# Patient Record
Sex: Female | Born: 1950 | Race: White | Hispanic: No | Smoking: Never smoker
Health system: Southern US, Community
[De-identification: ages and names within clinical notes are randomized; demographics above are authoritative.]

## PROBLEM LIST (undated history)

## (undated) DIAGNOSIS — E039 Hypothyroidism, unspecified: Secondary | ICD-10-CM

## (undated) DIAGNOSIS — R7303 Prediabetes: Secondary | ICD-10-CM

## (undated) DIAGNOSIS — E119 Type 2 diabetes mellitus without complications: Secondary | ICD-10-CM

## (undated) DIAGNOSIS — C9110 Chronic lymphocytic leukemia of B-cell type not having achieved remission: Secondary | ICD-10-CM

## (undated) DIAGNOSIS — M1712 Unilateral primary osteoarthritis, left knee: Secondary | ICD-10-CM

## (undated) DIAGNOSIS — Z87442 Personal history of urinary calculi: Secondary | ICD-10-CM

## (undated) DIAGNOSIS — I10 Essential (primary) hypertension: Secondary | ICD-10-CM

## (undated) DIAGNOSIS — K219 Gastro-esophageal reflux disease without esophagitis: Secondary | ICD-10-CM

## (undated) DIAGNOSIS — M1990 Unspecified osteoarthritis, unspecified site: Secondary | ICD-10-CM

## (undated) DIAGNOSIS — D649 Anemia, unspecified: Secondary | ICD-10-CM

## (undated) DIAGNOSIS — M109 Gout, unspecified: Secondary | ICD-10-CM

## (undated) HISTORY — PX: ABDOMINAL HYSTERECTOMY: SHX81

## (undated) HISTORY — PX: APPENDECTOMY: SHX54

## (undated) HISTORY — PX: OTHER SURGICAL HISTORY: SHX169

## (undated) HISTORY — PX: CHOLECYSTECTOMY: SHX55

---

## 2002-10-22 HISTORY — PX: GASTRIC BYPASS: SHX52

## 2003-08-05 ENCOUNTER — Emergency Department (HOSPITAL_COMMUNITY): Admission: EM | Admit: 2003-08-05 | Discharge: 2003-08-06 | Payer: Self-pay | Admitting: Emergency Medicine

## 2004-04-07 ENCOUNTER — Ambulatory Visit (HOSPITAL_COMMUNITY): Admission: RE | Admit: 2004-04-07 | Discharge: 2004-04-07 | Payer: Self-pay | Admitting: Gastroenterology

## 2008-04-14 ENCOUNTER — Encounter: Admission: RE | Admit: 2008-04-14 | Discharge: 2008-04-14 | Payer: Self-pay | Admitting: Family Medicine

## 2008-06-03 ENCOUNTER — Encounter: Admission: RE | Admit: 2008-06-03 | Discharge: 2008-06-03 | Payer: Self-pay | Admitting: Family Medicine

## 2011-03-09 NOTE — Op Note (Signed)
NAME:  Alice Scott, Alice Scott                        ACCOUNT NO.:  [id number]   MEDICAL RECORD NO.:  [id number]                   PATIENT TYPE:  AMB   LOCATION:  ENDO                                 FACILITY:  [facility]   PHYSICIAN:  Anselmo Rod, M.D.               DATE OF BIRTH:  Dec 08, 1950   DATE OF PROCEDURE:  04/07/2004  DATE OF DISCHARGE:                                 OPERATIVE REPORT   PROCEDURE PERFORMED:  Screening colonoscopy.   ENDOSCOPIST:  Anselmo Rod, M.D.   INSTRUMENT USED:  Olympus video colonoscope.   INDICATION FOR PROCEDURE:  A 60 year old white female with guaiac-positive  stools.  Rule out colonic polyps, masses, etc.   PREPROCEDURE PREPARATION:  Informed consent was procured from the patient.  The patient was fasted for eight hours prior to the procedure and prepped  with a bottle of magnesium citrate and a gallon of GoLYTELY the night prior  to the procedure.   PREPROCEDURE PHYSICAL:  VITAL SIGNS:  The patient had stable vital signs.  NECK:  Supple.  CHEST:  Clear to auscultation.  S1, S2 regular.  ABDOMEN:  Soft with normal bowel sounds.   DESCRIPTION OF PROCEDURE:  The patient was placed in the left lateral  decubitus position and sedated with 40 mg of Demerol and 6 mg of Versed  intravenously.  Once the patient was adequately sedate and maintained on low-  flow oxygen and continuous cardiac monitoring, the Olympus video colonoscope  was advanced from the rectum to the cecum.  The appendiceal orifice and the  ileocecal valve were clearly visualized and photographed.  No masses,  polyps, erosions, ulcerations, or diverticula were seen.  Small internal  hemorrhoids were appreciated on retroflexion in the rectum.  The patient  tolerated the procedure well without immediate complications.   IMPRESSION:  1. Normal colonoscopy up to the cecum except for small internal hemorrhoids.  2. No masses, polyps, or diverticula seen.   RECOMMENDATIONS:  1. Continue  a high-fiber diet.  2. Repeat guaiac testing in the office on an outpatient basis.  Further     recommendations will be made at that time.                                               Anselmo Rod, M.D.    JNM/MEDQ  D:  04/07/2004  T:  04/08/2004  Job:  [id number]   cc:   Maryelizabeth Rowan, M.D.  Fax: [phone]

## 2013-04-08 ENCOUNTER — Other Ambulatory Visit (HOSPITAL_COMMUNITY): Payer: Self-pay | Admitting: Orthopedic Surgery

## 2013-04-30 NOTE — Pre-Procedure Instructions (Signed)
Alice Scott  04/30/2013   Your procedure is scheduled on:  Wednesday, May 13, 2013  Report to [facility] at 6:30 AM. Come to main entrance "A"  Call this number if you have problems the morning of surgery: [phone]   Remember:   Do not eat food or drink liquids after midnight.   Take these medicines the morning of surgery with A SIP OF WATER:    Do not wear jewelry, make-up or nail polish.  Do not wear lotions, powders, or perfume,deodorant.  Do not shave 48 hours prior to surgery. .  Do not bring valuables to the hospital.  [facility] is not responsible  for any belongings or valuables.  Contacts, dentures or bridgework may not be worn into surgery.  Leave suitcase in the car. After surgery it may be brought to your room.  For patients admitted to the hospital, checkout time is 11:00 AM the day of discharge.   Patients discharged the day of surgery will not be allowed to drive home.  Name and phone number of your driver:   Special Instructions: Shower using CHG 2 nights before surgery and the night before surgery.  If you shower the day of surgery use CHG.  Use special wash - you have one bottle of CHG for all showers.  You should use approximately 1/3 of the bottle for each shower.   Please read over the following fact sheets that you were given: Pain Booklet, Coughing and Deep Breathing, Total Joint Packet, MRSA Information and Surgical Site Infection Prevention

## 2013-05-01 ENCOUNTER — Encounter (HOSPITAL_COMMUNITY): Payer: Self-pay | Admitting: Pharmacy Technician

## 2013-05-01 ENCOUNTER — Encounter (HOSPITAL_COMMUNITY)
Admission: RE | Admit: 2013-05-01 | Discharge: 2013-05-01 | Disposition: A | Discharge status: Home / Self Care | Payer: 59 | Source: Ambulatory Visit | Attending: Orthopedic Surgery | Admitting: Orthopedic Surgery

## 2013-05-01 ENCOUNTER — Encounter (HOSPITAL_COMMUNITY): Payer: Self-pay

## 2013-05-01 DIAGNOSIS — Z01812 Encounter for preprocedural laboratory examination: Secondary | ICD-10-CM | POA: Insufficient documentation

## 2013-05-01 DIAGNOSIS — Z01818 Encounter for other preprocedural examination: Secondary | ICD-10-CM | POA: Insufficient documentation

## 2013-05-01 HISTORY — DX: Unspecified osteoarthritis, unspecified site: M19.90

## 2013-05-01 HISTORY — DX: Gastro-esophageal reflux disease without esophagitis: K21.9

## 2013-05-01 HISTORY — DX: Anemia, unspecified: D64.9

## 2013-05-01 HISTORY — DX: Hypothyroidism, unspecified: E03.9

## 2013-05-01 HISTORY — DX: Gout, unspecified: M10.9

## 2013-05-01 LAB — CBC
HCT: 42.3 % (ref 36.0–46.0)
MCH: 32.9 pg (ref 26.0–34.0)
MCHC: 34.5 g/dL (ref 30.0–36.0)
MCV: 95.3 fL (ref 78.0–100.0)
RDW: 13 % (ref 11.5–15.5)
WBC: 8.1 10*3/uL (ref 4.0–10.5)

## 2013-05-01 LAB — COMPREHENSIVE METABOLIC PANEL
Albumin: 3.8 g/dL (ref 3.5–5.2)
BUN: 17 mg/dL (ref 6–23)
Calcium: 9.4 mg/dL (ref 8.4–10.5)
Chloride: 106 mEq/L (ref 96–112)
Creatinine, Ser: 0.79 mg/dL (ref 0.50–1.10)
GFR calc non Af Amer: 86 mL/min — ABNORMAL LOW (ref >90)
Total Bilirubin: 0.5 mg/dL (ref 0.3–1.2)

## 2013-05-01 LAB — SURGICAL PCR SCREEN
MRSA, PCR: NEGATIVE
Staphylococcus aureus: POSITIVE — AB

## 2013-05-01 LAB — ABO/RH: ABO/RH(D): O POS

## 2013-05-01 LAB — TYPE AND SCREEN
ABO/RH(D): O POS
Antibody Screen: NEGATIVE

## 2013-05-01 LAB — PROTIME-INR
INR: 0.94 (ref 0.00–1.49)
Prothrombin Time: 12.4 seconds (ref 11.6–15.2)

## 2013-05-01 MED ORDER — CEFAZOLIN SODIUM-DEXTROSE 2-3 GM-% IV SOLR: 2.0000 g | INTRAVENOUS | Status: DC

## 2013-05-01 NOTE — Progress Notes (Signed)
Notified dr. Lajoyce Corners office spoke with amy for rx for mupirocin to be called into [facility] for patient prior to surgery

## 2013-05-01 NOTE — Progress Notes (Signed)
Primary Physician - Dr. Elizabeth Palau Does not see a cardiologist  Patient is on lisinopril for "kidney protection" not for htn.  Had recent ekg last month will request from dr. Ewell Poe office No other cardiac testing

## 2013-05-13 ENCOUNTER — Encounter (HOSPITAL_COMMUNITY): Payer: Self-pay | Admitting: *Deleted

## 2013-05-13 ENCOUNTER — Inpatient Hospital Stay (HOSPITAL_COMMUNITY): Payer: 59 | Admitting: Certified Registered"

## 2013-05-13 ENCOUNTER — Encounter (HOSPITAL_COMMUNITY): Payer: Self-pay | Admitting: Certified Registered"

## 2013-05-13 ENCOUNTER — Encounter (HOSPITAL_COMMUNITY)
Admission: RE | Disposition: A | Discharge status: Home / Self Care | Payer: Self-pay | Source: Ambulatory Visit | Attending: Orthopedic Surgery

## 2013-05-13 ENCOUNTER — Inpatient Hospital Stay (HOSPITAL_COMMUNITY)
Admission: RE | Admit: 2013-05-13 | Discharge: 2013-05-16 | DRG: 470 | Disposition: A | Discharge status: Home / Self Care | Payer: 59 | Source: Ambulatory Visit | Attending: Orthopedic Surgery | Admitting: Orthopedic Surgery

## 2013-05-13 DIAGNOSIS — Z96651 Presence of right artificial knee joint: Secondary | ICD-10-CM

## 2013-05-13 DIAGNOSIS — M1710 Unilateral primary osteoarthritis, unspecified knee: Principal | ICD-10-CM | POA: Diagnosis present

## 2013-05-13 DIAGNOSIS — Z7982 Long term (current) use of aspirin: Secondary | ICD-10-CM

## 2013-05-13 DIAGNOSIS — K219 Gastro-esophageal reflux disease without esophagitis: Secondary | ICD-10-CM | POA: Diagnosis present

## 2013-05-13 DIAGNOSIS — E039 Hypothyroidism, unspecified: Secondary | ICD-10-CM | POA: Diagnosis present

## 2013-05-13 HISTORY — PX: TOTAL KNEE ARTHROPLASTY: SHX125

## 2013-05-13 SURGERY — ARTHROPLASTY, KNEE, TOTAL
Anesthesia: Regional | Site: Knee | Laterality: Right | Wound class: Clean

## 2013-05-13 MED ORDER — MENTHOL 3 MG MT LOZG: 1.0000 | LOZENGE | OROMUCOSAL | Status: DC | PRN

## 2013-05-13 MED ORDER — ONDANSETRON HCL 4 MG/2ML IJ SOLN
INTRAMUSCULAR | Status: AC
  Filled 2013-05-13: qty 2

## 2013-05-13 MED ORDER — ASPIRIN EC 325 MG PO TBEC
325.0000 mg | DELAYED_RELEASE_TABLET | Freq: Every day | ORAL | Status: DC
  Administered 2013-05-14 – 2013-05-16 (×3): 325 mg via ORAL
  Filled 2013-05-13 (×4): qty 1

## 2013-05-13 MED ORDER — KETOROLAC TROMETHAMINE 30 MG/ML IJ SOLN
INTRAMUSCULAR | Status: AC
  Filled 2013-05-13: qty 1

## 2013-05-13 MED ORDER — GLYCOPYRROLATE 0.2 MG/ML IJ SOLN
INTRAMUSCULAR | Status: DC | PRN
  Administered 2013-05-13: 0.4 mg via INTRAVENOUS

## 2013-05-13 MED ORDER — SODIUM CHLORIDE 0.9 % IV SOLN
INTRAVENOUS | Status: DC
  Administered 2013-05-13: 15:00:00 via INTRAVENOUS

## 2013-05-13 MED ORDER — LIDOCAINE HCL (CARDIAC) 20 MG/ML IV SOLN
INTRAVENOUS | Status: DC | PRN
  Administered 2013-05-13: 80 mg via INTRAVENOUS

## 2013-05-13 MED ORDER — NEOSTIGMINE METHYLSULFATE 1 MG/ML IJ SOLN
INTRAMUSCULAR | Status: DC | PRN
  Administered 2013-05-13: 2 mg via INTRAVENOUS

## 2013-05-13 MED ORDER — HYDROMORPHONE HCL PF 1 MG/ML IJ SOLN
0.2500 mg | INTRAMUSCULAR | Status: DC | PRN
  Administered 2013-05-13 (×2): 0.5 mg via INTRAVENOUS

## 2013-05-13 MED ORDER — ACETAMINOPHEN 325 MG PO TABS
650.0000 mg | ORAL_TABLET | Freq: Four times a day (QID) | ORAL | Status: DC | PRN
  Administered 2013-05-14: 650 mg via ORAL
  Filled 2013-05-13: qty 2

## 2013-05-13 MED ORDER — OXYCODONE HCL 5 MG/5ML PO SOLN: 5.0000 mg | Freq: Once | ORAL | Status: DC | PRN

## 2013-05-13 MED ORDER — PHENOL 1.4 % MT LIQD: 1.0000 | OROMUCOSAL | Status: DC | PRN

## 2013-05-13 MED ORDER — KETOROLAC TROMETHAMINE 15 MG/ML IJ SOLN
15.0000 mg | Freq: Four times a day (QID) | INTRAMUSCULAR | Status: AC
  Administered 2013-05-13 – 2013-05-14 (×4): 15 mg via INTRAVENOUS
  Filled 2013-05-13 (×3): qty 1

## 2013-05-13 MED ORDER — METOCLOPRAMIDE HCL 5 MG/ML IJ SOLN: 5.0000 mg | Freq: Three times a day (TID) | INTRAMUSCULAR | Status: DC | PRN

## 2013-05-13 MED ORDER — HYDROCODONE-ACETAMINOPHEN 5-325 MG PO TABS
1.0000 | ORAL_TABLET | ORAL | Status: DC | PRN
  Administered 2013-05-15 – 2013-05-16 (×2): 2 via ORAL
  Filled 2013-05-13 (×2): qty 2

## 2013-05-13 MED ORDER — ONDANSETRON HCL 4 MG PO TABS: 4.0000 mg | ORAL_TABLET | Freq: Four times a day (QID) | ORAL | Status: DC | PRN

## 2013-05-13 MED ORDER — ONDANSETRON HCL 4 MG/2ML IJ SOLN
INTRAMUSCULAR | Status: DC | PRN
  Administered 2013-05-13: 4 mg via INTRAVENOUS

## 2013-05-13 MED ORDER — PROPOFOL 10 MG/ML IV BOLUS
INTRAVENOUS | Status: DC | PRN
  Administered 2013-05-13: 170 mg via INTRAVENOUS

## 2013-05-13 MED ORDER — SENNOSIDES-DOCUSATE SODIUM 8.6-50 MG PO TABS: 1.0000 | ORAL_TABLET | Freq: Every evening | ORAL | Status: DC | PRN

## 2013-05-13 MED ORDER — DOCUSATE SODIUM 100 MG PO CAPS
100.0000 mg | ORAL_CAPSULE | Freq: Two times a day (BID) | ORAL | Status: DC
  Administered 2013-05-13 – 2013-05-14 (×3): 100 mg via ORAL
  Filled 2013-05-13 (×7): qty 1

## 2013-05-13 MED ORDER — BISACODYL 5 MG PO TBEC: 5.0000 mg | DELAYED_RELEASE_TABLET | Freq: Every day | ORAL | Status: DC | PRN

## 2013-05-13 MED ORDER — HYDROMORPHONE HCL PF 1 MG/ML IJ SOLN
INTRAMUSCULAR | Status: AC
  Filled 2013-05-13: qty 1

## 2013-05-13 MED ORDER — MAGNESIUM CITRATE PO SOLN
1.0000 | Freq: Once | ORAL | Status: AC | PRN
  Filled 2013-05-13: qty 296

## 2013-05-13 MED ORDER — CEFAZOLIN SODIUM-DEXTROSE 2-3 GM-% IV SOLR
2.0000 g | Freq: Four times a day (QID) | INTRAVENOUS | Status: AC
  Administered 2013-05-13 (×2): 2 g via INTRAVENOUS
  Filled 2013-05-13 (×2): qty 50

## 2013-05-13 MED ORDER — METOCLOPRAMIDE HCL 10 MG PO TABS: 5.0000 mg | ORAL_TABLET | Freq: Three times a day (TID) | ORAL | Status: DC | PRN

## 2013-05-13 MED ORDER — ONDANSETRON HCL 4 MG/2ML IJ SOLN
4.0000 mg | Freq: Four times a day (QID) | INTRAMUSCULAR | Status: AC | PRN
  Administered 2013-05-13: 4 mg via INTRAVENOUS

## 2013-05-13 MED ORDER — ACETAMINOPHEN 650 MG RE SUPP: 650.0000 mg | Freq: Four times a day (QID) | RECTAL | Status: DC | PRN

## 2013-05-13 MED ORDER — OXYCODONE HCL 5 MG PO TABS: 5.0000 mg | ORAL_TABLET | Freq: Once | ORAL | Status: DC | PRN

## 2013-05-13 MED ORDER — PANTOPRAZOLE SODIUM 40 MG PO TBEC
40.0000 mg | DELAYED_RELEASE_TABLET | Freq: Every day | ORAL | Status: DC
  Administered 2013-05-14 – 2013-05-16 (×3): 40 mg via ORAL
  Filled 2013-05-13 (×3): qty 1

## 2013-05-13 MED ORDER — ONDANSETRON HCL 4 MG/2ML IJ SOLN
4.0000 mg | Freq: Four times a day (QID) | INTRAMUSCULAR | Status: DC | PRN
  Administered 2013-05-13: 4 mg via INTRAVENOUS
  Filled 2013-05-13: qty 2

## 2013-05-13 MED ORDER — LACTATED RINGERS IV SOLN
INTRAVENOUS | Status: DC | PRN
  Administered 2013-05-13 (×2): via INTRAVENOUS

## 2013-05-13 MED ORDER — BUPIVACAINE-EPINEPHRINE PF 0.5-1:200000 % IJ SOLN
INTRAMUSCULAR | Status: DC | PRN
  Administered 2013-05-13: 30 mL

## 2013-05-13 MED ORDER — FENTANYL CITRATE 0.05 MG/ML IJ SOLN
INTRAMUSCULAR | Status: DC | PRN
  Administered 2013-05-13 (×3): 50 ug via INTRAVENOUS
  Administered 2013-05-13: 150 ug via INTRAVENOUS

## 2013-05-13 MED ORDER — CEFAZOLIN SODIUM-DEXTROSE 2-3 GM-% IV SOLR
INTRAVENOUS | Status: AC
  Administered 2013-05-13: 2 g via INTRAVENOUS
  Filled 2013-05-13: qty 50

## 2013-05-13 MED ORDER — HYDROMORPHONE HCL PF 1 MG/ML IJ SOLN: 1.0000 mg | INTRAMUSCULAR | Status: DC | PRN

## 2013-05-13 MED ORDER — SODIUM CHLORIDE 0.9 % IR SOLN
Status: DC | PRN
  Administered 2013-05-13: 3000 mL

## 2013-05-13 MED ORDER — MIDAZOLAM HCL 5 MG/5ML IJ SOLN
INTRAMUSCULAR | Status: DC | PRN
  Administered 2013-05-13: 2 mg via INTRAVENOUS

## 2013-05-13 MED ORDER — LISINOPRIL 10 MG PO TABS
10.0000 mg | ORAL_TABLET | Freq: Every day | ORAL | Status: DC
  Administered 2013-05-14 – 2013-05-16 (×3): 10 mg via ORAL
  Filled 2013-05-13 (×4): qty 1

## 2013-05-13 MED ORDER — FERROUS SULFATE 325 (65 FE) MG PO TABS
325.0000 mg | ORAL_TABLET | Freq: Three times a day (TID) | ORAL | Status: DC
  Administered 2013-05-14 – 2013-05-16 (×7): 325 mg via ORAL
  Filled 2013-05-13 (×12): qty 1

## 2013-05-13 MED ORDER — LEVOTHYROXINE SODIUM 137 MCG PO TABS
137.0000 ug | ORAL_TABLET | Freq: Every day | ORAL | Status: DC
  Administered 2013-05-14 – 2013-05-16 (×3): 137 ug via ORAL
  Filled 2013-05-13 (×4): qty 1

## 2013-05-13 MED ORDER — ROCURONIUM BROMIDE 100 MG/10ML IV SOLN
INTRAVENOUS | Status: DC | PRN
  Administered 2013-05-13: 50 mg via INTRAVENOUS

## 2013-05-13 SURGICAL SUPPLY — 49 items
BANDAGE GAUZE ELAST BULKY 4 IN (GAUZE/BANDAGES/DRESSINGS) ×2 IMPLANT
BLADE SAGITTAL 25.0X1.27X90 (BLADE) ×2 IMPLANT
BLADE SAW SGTL 13.0X1.19X90.0M (BLADE) ×2 IMPLANT
BLADE SURG 21 STRL SS (BLADE) ×4 IMPLANT
BNDG COHESIVE 6X5 TAN STRL LF (GAUZE/BANDAGES/DRESSINGS) ×2 IMPLANT
BONE CEMENT PALACOSE (Orthopedic Implant) ×4 IMPLANT
BOWL SMART MIX CTS (DISPOSABLE) IMPLANT
CAP FLEX FEMUR MOB CROSSLINK ×2 IMPLANT
CEMENT BONE PALACOSE (Orthopedic Implant) ×2 IMPLANT
CLOTH BEACON ORANGE TIMEOUT ST (SAFETY) ×2 IMPLANT
COVER SURGICAL LIGHT HANDLE (MISCELLANEOUS) ×2 IMPLANT
CUFF TOURNIQUET SINGLE 34IN LL (TOURNIQUET CUFF) IMPLANT
CUFF TOURNIQUET SINGLE 44IN (TOURNIQUET CUFF) IMPLANT
DRAPE EXTREMITY T 121X128X90 (DRAPE) ×2 IMPLANT
DRAPE PROXIMA HALF (DRAPES) ×2 IMPLANT
DRAPE U-SHAPE 47X51 STRL (DRAPES) ×2 IMPLANT
DRSG ADAPTIC 3X8 NADH LF (GAUZE/BANDAGES/DRESSINGS) ×2 IMPLANT
DRSG PAD ABDOMINAL 8X10 ST (GAUZE/BANDAGES/DRESSINGS) ×2 IMPLANT
DURAPREP 26ML APPLICATOR (WOUND CARE) ×2 IMPLANT
ELECT REM PT RETURN 9FT ADLT (ELECTROSURGICAL) ×2
ELECTRODE REM PT RTRN 9FT ADLT (ELECTROSURGICAL) ×1 IMPLANT
FACESHIELD LNG OPTICON STERILE (SAFETY) ×2 IMPLANT
GLOVE BIOGEL PI IND STRL 9 (GLOVE) ×1 IMPLANT
GLOVE BIOGEL PI INDICATOR 9 (GLOVE) ×1
GLOVE SURG ORTHO 9.0 STRL STRW (GLOVE) ×2 IMPLANT
GOWN PREVENTION PLUS XLARGE (GOWN DISPOSABLE) ×2 IMPLANT
GOWN SRG XL XLNG 56XLVL 4 (GOWN DISPOSABLE) ×2 IMPLANT
GOWN STRL NON-REIN XL XLG LVL4 (GOWN DISPOSABLE) ×4
HANDPIECE INTERPULSE COAX TIP (DISPOSABLE)
KIT BASIN OR (CUSTOM PROCEDURE TRAY) ×2 IMPLANT
KIT ROOM TURNOVER OR (KITS) ×2 IMPLANT
MANIFOLD NEPTUNE II (INSTRUMENTS) ×2 IMPLANT
NEEDLE SPNL 18GX3.5 QUINCKE PK (NEEDLE) ×2 IMPLANT
NS IRRIG 1000ML POUR BTL (IV SOLUTION) ×2 IMPLANT
PACK TOTAL JOINT (CUSTOM PROCEDURE TRAY) ×2 IMPLANT
PAD ARMBOARD 7.5X6 YLW CONV (MISCELLANEOUS) ×4 IMPLANT
PADDING CAST COTTON 6X4 STRL (CAST SUPPLIES) ×2 IMPLANT
SET HNDPC FAN SPRY TIP SCT (DISPOSABLE) IMPLANT
SPONGE GAUZE 4X4 12PLY (GAUZE/BANDAGES/DRESSINGS) ×2 IMPLANT
STAPLER VISISTAT 35W (STAPLE) ×2 IMPLANT
SUCTION FRAZIER TIP 10 FR DISP (SUCTIONS) IMPLANT
SUT VIC AB 0 CTB1 27 (SUTURE) IMPLANT
SUT VIC AB 1 CTX 36 (SUTURE)
SUT VIC AB 1 CTX36XBRD ANBCTR (SUTURE) IMPLANT
TOWEL OR 17X24 6PK STRL BLUE (TOWEL DISPOSABLE) ×2 IMPLANT
TOWEL OR 17X26 10 PK STRL BLUE (TOWEL DISPOSABLE) ×2 IMPLANT
TRAY FOLEY CATH 16FRSI W/METER (SET/KITS/TRAYS/PACK) IMPLANT
WATER STERILE IRR 1000ML POUR (IV SOLUTION) ×4 IMPLANT
WRAP KNEE MAXI GEL POST OP (GAUZE/BANDAGES/DRESSINGS) ×2 IMPLANT

## 2013-05-13 NOTE — Progress Notes (Signed)
UR COMPLETED  

## 2013-05-13 NOTE — Preoperative (Signed)
Beta Blockers   Reason not to administer Beta Blockers:Not Applicable 

## 2013-05-13 NOTE — Evaluation (Signed)
Physical Therapy Evaluation Patient Details Name: Alice Scott MRN: [id number] DOB: 1951-04-23 Today's Date: 05/13/2013 Time: 9562-1308 PT Time Calculation (min): 20 min  PT Assessment / Plan / Recommendation History of Present Illness  s/p elective R TKA   Clinical Impression  Pt is s/p R TKA POD#0 resulting in the deficits listed below (see PT Problem List). Pt will benefit from skilled PT to increase their independence and safety with mobility to allow discharge to the venue listed below. Pt limited today due to nausea and vomiting. Pt is highly motivated to D/C with HHPT to mother's house, discussed with pt the goals that will need to be met in order to ensure safe D/C plan, pt verbalized understanding.      PT Assessment  Patient needs continued PT services    Follow Up Recommendations  Home health PT;Supervision/Assistance - 24 hour    Does the patient have the potential to tolerate intense rehabilitation      Barriers to Discharge        Equipment Recommendations  3in1 (PT)    Recommendations for Other Services OT consult   Frequency 7X/week    Precautions / Restrictions Precautions Precautions: Knee;Fall Precaution Booklet Issued: Yes (comment) Precaution Comments: pt given TKA exercise protocol handout Restrictions Weight Bearing Restrictions: Yes RLE Weight Bearing: Weight bearing as tolerated Other Position/Activity Restrictions: no pillow under knee   Pertinent Vitals/Pain 5/10      Mobility  Bed Mobility Bed Mobility: Supine to Sit;Sitting - Scoot to Edge of Bed Supine to Sit: 4: Min guard;HOB elevated;With rails Sitting - Scoot to Edge of Bed: 5: Supervision;With rail Details for Bed Mobility Assistance: min guard to initiate movement of R LE; pt requires increased time due to pain and nausea; vc's for hand placement and sequencing; relies on rails and HOB elevated >30degrees Transfers Transfers: Sit to Stand;Stand to Sit;Stand Pivot Transfers Sit  to Stand: 3: Mod assist;From bed;From elevated surface;With upper extremity assist Stand to Sit: 3: Mod assist;To chair/3-in-1;With armrests;With upper extremity assist Stand Pivot Transfers: 3: Mod assist Details for Transfer Assistance: pt required mod (A) to achieve sit to stand; required 2 attempts; during first attempt pt became off balance and required to sit back on bed; pt given mod cues for hand placement, sequencing and safety with RW; during tansfer R LE began to buckle causing pt to become anxious to sit in chair Ambulation/Gait Ambulation/Gait Assistance: Not tested (comment) (R LE buckling and pt nauseous) Stairs: No Wheelchair Mobility Wheelchair Mobility: No    Exercises Total Joint Exercises Ankle Circles/Pumps: 10 reps;Seated;Both Quad Sets: 10 reps;Seated;Right Heel Slides: 10 reps;Seated;Right   PT Diagnosis: Difficulty walking;Acute pain  PT Problem List: Decreased strength;Decreased range of motion;Decreased activity tolerance;Decreased balance;Decreased mobility;Decreased knowledge of use of DME;Decreased safety awareness;Pain PT Treatment Interventions: DME instruction;Gait training;Stair training;Functional mobility training;Therapeutic activities;Therapeutic exercise;Balance training;Neuromuscular re-education;Patient/family education     PT Goals(Current goals can be found in the care plan section) Acute Rehab PT Goals Patient Stated Goal: to go home with mom PT Goal Formulation: With patient Time For Goal Achievement: 05/20/13 Potential to Achieve Goals: Good  Visit Information  Last PT Received On: 05/13/13 Assistance Needed: +2 (for safety to follow with chair) History of Present Illness: s/p elective R TKA        Prior Functioning  Home Living Family/patient expects to be discharged to:: Private residence Living Arrangements: Alone Available Help at Discharge: Family;Available 24 hours/day (is planning to D/C with mother ) Type of Home:  House  Home Layout: One level Home Equipment: Grab bars - tub/shower;Shower seat;Walker - 4 wheels;Other (comment) Additional Comments: Pt lives alone so is planning to D/C with mother; above is information for mother's house Prior Function Level of Independence: Independent Communication Communication: No difficulties Dominant Hand: Right    Cognition  Cognition Arousal/Alertness: Awake/alert Behavior During Therapy: WFL for tasks assessed/performed Overall Cognitive Status: Within Functional Limits for tasks assessed    Extremity/Trunk Assessment Upper Extremity Assessment Upper Extremity Assessment: Defer to OT evaluation Lower Extremity Assessment Lower Extremity Assessment: RLE deficits/detail RLE Deficits / Details: ankle DF/PF WFL; unable to assess knee fully due to pain; grossly 2+/5 RLE: Unable to fully assess due to pain RLE Sensation:  (WFL to light touch) Cervical / Trunk Assessment Cervical / Trunk Assessment: Normal   Balance Balance Balance Assessed: Yes Static Sitting Balance Static Sitting - Balance Support: Left upper extremity supported;No upper extremity supported;Feet supported;Feet unsupported Static Sitting - Level of Assistance: 5: Stand by assistance Static Sitting - Comment/# of Minutes: pt became nauseous and required sitting EOB ~3 min while nausea subsided; pt vomited while on EOB  End of Session PT - End of Session Equipment Utilized During Treatment: Gait belt Activity Tolerance: Other (comment) (limited by nausea/vomiting ) Patient left: in chair;with call bell/phone within reach Nurse Communication: Mobility status;Other (comment) (pt became nauseous)  GP     Shelva Majestic [place], [address] [phone] 05/13/2013, 3:18 PM

## 2013-05-13 NOTE — Transfer of Care (Signed)
Immediate Anesthesia Transfer of Care Note  Patient: Alice Scott  Procedure(s) Performed: Procedure(s) with comments: TOTAL KNEE ARTHROPLASTY (Right) - Right Total Knee Arthroplasty  Patient Location: PACU  Anesthesia Type:GA combined with regional for post-op pain  Level of Consciousness: awake, alert  and oriented  Airway & Oxygen Therapy: Patient Spontanous Breathing and Patient connected to nasal cannula oxygen  Post-op Assessment: Report given to PACU RN, Post -op Vital signs reviewed and stable and Patient moving all extremities  Post vital signs: Reviewed and stable  Complications: No apparent anesthesia complications

## 2013-05-13 NOTE — H&P (Signed)
TOTAL KNEE ADMISSION H&P  Patient is being admitted for right total knee arthroplasty.  Subjective:  Chief Complaint:right knee pain.  HPI: Alice Scott, 62 y.o. female, has a history of pain and functional disability in the right knee due to arthritis and has failed non-surgical conservative treatments for greater than 12 weeks to includeNSAID's and/or analgesics, corticosteriod injections, use of assistive devices and activity modification.  Onset of symptoms was gradual, starting 8 years ago with gradually worsening course since that time. The patient noted no past surgery on the right knee(s).  Patient currently rates pain in the right knee(s) at 8 out of 10 with activity. Patient has night pain, worsening of pain with activity and weight bearing, pain that interferes with activities of daily living, pain with passive range of motion, crepitus and joint swelling.  Patient has evidence of subchondral sclerosis, periarticular osteophytes and joint space narrowing by imaging studies. This patient has had Failure conservative therapy. There is no active infection.  There are no active problems to display for this patient.  Past Medical History  Diagnosis Date  . Hypothyroidism   . GERD (gastroesophageal reflux disease)   . Arthritis   . Anemia   . Gout     Past Surgical History  Procedure Laterality Date  . Gastric bypass  2004  . Arthroscopic knee Right   . Abdominal hysterectomy    . Cholecystectomy    . Appendectomy      Prescriptions prior to admission  Medication Sig Dispense Refill  . CALCIUM PO Take 100 mg by mouth daily.      . Cholecalciferol (VITAMIN D PO) Take 2,000 Units by mouth daily.      Marland Kitchen levothyroxine (SYNTHROID, LEVOTHROID) 137 MCG tablet Take 137 mcg by mouth daily before breakfast.      . lisinopril (PRINIVIL,ZESTRIL) 10 MG tablet Take 10 mg by mouth daily.      . Multiple Vitamin (MULTIVITAMIN WITH MINERALS) TABS Take 1 tablet by mouth daily.      .  multivitamin-lutein (OCUVITE-LUTEIN) CAPS Take 1 capsule by mouth daily.      Marland Kitchen omeprazole (PRILOSEC) 40 MG capsule Take 40 mg by mouth daily.       Allergies  Allergen Reactions  . Codeine Nausea And Vomiting  . Morphine And Related Nausea And Vomiting    History  Substance Use Topics  . Smoking status: Never Smoker   . Smokeless tobacco: Not on file  . Alcohol Use: No    No family history on file.   Review of Systems  All other systems reviewed and are negative.    Objective:  Physical Exam  Vital signs in last 24 hours:    Labs:   There is no height or weight on file to calculate BMI.   Imaging Review Plain radiographs demonstrate moderate degenerative joint disease of the right knee(s). The overall alignment ismild varus. The bone quality appears to be adequate for age and reported activity level.  Assessment/Plan:  End stage arthritis, right knee   The patient history, physical examination, clinical judgment of the provider and imaging studies are consistent with end stage degenerative joint disease of the right knee(s) and total knee arthroplasty is deemed medically necessary. The treatment options including medical management, injection therapy arthroscopy and arthroplasty were discussed at length. The risks and benefits of total knee arthroplasty were presented and reviewed. The risks due to aseptic loosening, infection, stiffness, patella tracking problems, thromboembolic complications and other imponderables were discussed. The patient  acknowledged the explanation, agreed to proceed with the plan and consent was signed. Patient is being admitted for inpatient treatment for surgery, pain control, PT, OT, prophylactic antibiotics, VTE prophylaxis, progressive ambulation and ADL's and discharge planning. The patient is planning to be discharged home with home health services

## 2013-05-13 NOTE — Anesthesia Preprocedure Evaluation (Signed)
Anesthesia Evaluation  Patient identified by MRN, date of birth, ID band Patient awake    Reviewed: Allergy & Precautions, H&P , NPO status , Patient's Chart, lab work & pertinent test results  Airway Mallampati: II  Neck ROM: full    Dental   Pulmonary          Cardiovascular     Neuro/Psych    GI/Hepatic GERD-  ,  Endo/Other  Hypothyroidism obese  Renal/GU      Musculoskeletal  (+) Arthritis -,   Abdominal   Peds  Hematology   Anesthesia Other Findings   Reproductive/Obstetrics                           Anesthesia Physical Anesthesia Plan  ASA: II  Anesthesia Plan: General and Regional   Post-op Pain Management: MAC Combined w/ Regional for Post-op pain   Induction: Intravenous  Airway Management Planned: Oral ETT  Additional Equipment:   Intra-op Plan:   Post-operative Plan: Extubation in OR  Informed Consent: I have reviewed the patients History and Physical, chart, labs and discussed the procedure including the risks, benefits and alternatives for the proposed anesthesia with the patient or authorized representative who has indicated his/her understanding and acceptance.     Plan Discussed with: CRNA, Anesthesiologist and Surgeon  Anesthesia Plan Comments:         Anesthesia Quick Evaluation

## 2013-05-13 NOTE — Plan of Care (Signed)
Problem: Consults Goal: Diagnosis- Total Joint Replacement Primary Total Knee Right     

## 2013-05-13 NOTE — Anesthesia Procedure Notes (Addendum)
Anesthesia Regional Block:  Femoral nerve block  Pre-Anesthetic Checklist: ,, timeout performed, Correct Patient, Correct Site, Correct Laterality, Correct Procedure,, site marked, risks and benefits discussed, Surgical consent,  Pre-op evaluation,  At surgeon's request and post-op pain management  Laterality: Right  Prep: chloraprep       Needles:  Injection technique: Single-shot  Needle Type: Echogenic Stimulator Needle     Needle Length: 9cm  Needle Gauge: 21    Additional Needles:  Procedures: nerve stimulator Femoral nerve block  Nerve Stimulator or Paresthesia:  Response: Quadriceps muscle contraction, 0.45 mA,   Additional Responses:   Narrative:  Start time: 05/13/2013 7:52 AM End time: 05/13/2013 8:03 AM Injection made incrementally with aspirations every 5 mL.  Performed by: Personally  Anesthesiologist: Dr Chaney Malling  Additional Notes: Functioning IV was confirmed and monitors were applied.  A 90mm 21ga Arrow echogenic stimulator needle was used. Sterile prep and drape,hand hygiene and sterile gloves were used.  Negative aspiration and negative test dose prior to incremental administration of local anesthetic. The patient tolerated the procedure well.    Femoral nerve block Procedure Name: Intubation Date/Time: 05/13/2013 8:43 AM Performed by: Charm Barges, Joliyah Lippens R Pre-anesthesia Checklist: Patient identified, Emergency Drugs available, Suction available, Patient being monitored and Timeout performed Patient Re-evaluated:Patient Re-evaluated prior to inductionOxygen Delivery Method: Circle system utilized Preoxygenation: Pre-oxygenation with 100% oxygen Intubation Type: IV induction Ventilation: Mask ventilation without difficulty Laryngoscope Size: Mac and 3 Grade View: Grade III Tube type: Oral Tube size: 7.5 mm Number of attempts: 1 Airway Equipment and Method: Stylet Placement Confirmation: ETT inserted through vocal cords under direct vision,  positive  ETCO2 and breath sounds checked- equal and bilateral Secured at: 22 cm Tube secured with: Tape Dental Injury: Teeth and Oropharynx as per pre-operative assessment

## 2013-05-13 NOTE — Anesthesia Postprocedure Evaluation (Signed)
Anesthesia Post Note  Patient: Alice Scott  Procedure(s) Performed: Procedure(s) (LRB): TOTAL KNEE ARTHROPLASTY (Right)  Anesthesia type: General  Patient location: PACU  Post pain: Pain level controlled and Adequate analgesia  Post assessment: Post-op Vital signs reviewed, Patient's Cardiovascular Status Stable, Respiratory Function Stable, Patent Airway and Pain level controlled  Last Vitals:  Filed Vitals:   05/13/13 1115  BP: 132/64  Pulse: 63  Temp:   Resp: 13    Post vital signs: Reviewed and stable  Level of consciousness: awake, alert  and oriented  Complications: No apparent anesthesia complications

## 2013-05-13 NOTE — Op Note (Signed)
OPERATIVE REPORT  DATE OF SURGERY: 05/13/2013  PATIENT:  Alice Scott,  62 y.o. female  PRE-OPERATIVE DIAGNOSIS:  Osteoarthritis Right Knee  POST-OPERATIVE DIAGNOSIS:  Osteoarthritis Right Knee  PROCEDURE:  Procedure(s): TOTAL KNEE ARTHROPLASTY Zimmer components. Size 4 tibia. Size C. femur. 10 mm polyethylene. 29 mm patella.  SURGEON:  Surgeon(s): Nadara Mustard, MD  ANESTHESIA:   general  EBL:  min ML  SPECIMEN:  No Specimen  TOURNIQUET:   Total Tourniquet Time Documented: Thigh (Right) - 36 minutes Total: Thigh (Right) - 36 minutes   PROCEDURE DETAILS: Patient is a 62 year old woman with osteoarthritis tricompartmental right knee she has failed conservative treatment and presents at this time for surgical intervention. Risks and benefits were discussed including infection neurovascular injury pain DVT pulmonary embolus need for additional surgery. Patient states she understands and wished to proceed at this time. Description of procedure patient was brought to the operating room and underwent a general anesthetic. After adequate levels and anesthesia were obtained patient's right lower extremity was prepped using DuraPrep draped into a sterile field Ioban was used to cover all exposed skin. A midline incision was made carried down to a medial parapatellar retinacular incision. IM guide was used for to take 10 mm off the distal femur this sized for a size C. and chamfer cuts were made for the size C. femur. 10 mm was taken off the proximal tibia with 7 posterior slope neutral varus and valgus alignment with the external alignment. The box cut was made for the femur. Trial components were placed and the knee was stable with a 10 mm poly-tray. The patella was resurfaced with a 29 mm patella 10 mm was taken off the patella. The patella tracked midline. The trial components removed the wound was irrigated with pulsatile lavage the tourniquet was then inflated. The tibial and  femoral components were cement the clamp was removed the knee was placed through full range of motion in the knee was stable varus and valgus and stable with the patella tracking. The retinaculum was closed using #1 Vicryl subcutaneous is closed using 0 Vicryl the skin was closed using staples. Wound is covered with Adaptic orthopedic sponges AB dressing Kerlix and Coban. Patient was extubated taken to the PACU in stable condition.ed in place and loose cement was removed the knee was left in extension until the cement hardened the tibial tray and place. The patella was clamped also left clamped until the cement hardened. The tourniquet was deflated hemostasis was obtained  PLAN OF CARE: Admit to inpatient   PATIENT DISPOSITION:  PACU - hemodynamically stable.   Nadara Mustard, MD 05/13/2013 10:31 AM

## 2013-05-13 NOTE — Progress Notes (Signed)
Report given to kay rn as caregiver 

## 2013-05-14 ENCOUNTER — Encounter (HOSPITAL_COMMUNITY): Payer: Self-pay | Admitting: General Practice

## 2013-05-14 LAB — BASIC METABOLIC PANEL
BUN: 11 mg/dL (ref 6–23)
Chloride: 106 mEq/L (ref 96–112)
GFR calc Af Amer: >90 mL/min (ref >90)
GFR calc non Af Amer: 88 mL/min — ABNORMAL LOW (ref >90)
Potassium: 3.6 mEq/L (ref 3.5–5.1)
Sodium: 140 mEq/L (ref 135–145)

## 2013-05-14 LAB — CBC
MCHC: 33.7 g/dL (ref 30.0–36.0)
Platelets: 165 10*3/uL (ref 150–400)
RDW: 13.2 % (ref 11.5–15.5)
WBC: 9.1 10*3/uL (ref 4.0–10.5)

## 2013-05-14 NOTE — Progress Notes (Signed)
OT Cancellation Note  Patient Details Name: Alice Scott MRN: [id number] DOB: 1951-08-19   Cancelled Treatment:    Reason Eval/Treat Not Completed: Fatigue/lethargy limiting ability to participate.  Will continue to follow.  Evern Bio 05/14/2013, 1:26 PM

## 2013-05-14 NOTE — Progress Notes (Signed)
Physical Therapy Treatment Patient Details Name: Alice Scott MRN: [id number] DOB: 01-May-1951 Today's Date: 05/14/2013 Time: 4098-1191 PT Time Calculation (min): 24 min  PT Assessment / Plan / Recommendation  History of Present Illness s/p elective R TKA    Clinical Impression Pt making steady progress and is highly motivated. After ambulating hallways, pt became nauseous and vomited. Pt stated after "now i feel much better". Plan to attempt steps in morning, anticipating D/C home when medically ready.    PT Comments   Pt will cont to benefit from skilled PT.   Follow Up Recommendations  Home health PT;Supervision/Assistance - 24 hour     Does the patient have the potential to tolerate intense rehabilitation     Barriers to Discharge        Equipment Recommendations  3in1 (PT)    Recommendations for Other Services OT consult  Frequency 7X/week   Progress towards PT Goals Progress towards PT goals: Progressing toward goals  Plan Current plan remains appropriate    Precautions / Restrictions Precautions Precautions: Knee;Fall Restrictions Weight Bearing Restrictions: Yes RLE Weight Bearing: Weight bearing as tolerated Other Position/Activity Restrictions: no pillow under knee   Pertinent Vitals/Pain 6/10; premedicated.    Mobility  Bed Mobility Bed Mobility: Not assessed Transfers Transfers: Sit to Stand;Stand to Sit Sit to Stand: 5: Supervision;From chair/3-in-1;With armrests Stand to Sit: 5: Supervision;To chair/3-in-1;With armrests Details for Transfer Assistance: supervision for min cues for hand placement and safety  Ambulation/Gait Ambulation/Gait Assistance: 4: Min guard Ambulation Distance (Feet): 100 Feet Assistive device: Rolling walker Ambulation/Gait Assistance Details: vc's for gt sequencing; encouraged pt to progress to stepthrough gt which pt was able to; vc's to increase heel strike on R LE; pt UEs becomes fatigued and required 2 standing rest  breaks to relieve UEs Gait Pattern: Step-through pattern;Step-to pattern;Decreased stance time - right;Decreased step length - left;Antalgic Gait velocity: decreased Stairs: No Wheelchair Mobility Wheelchair Mobility: No    Exercises Total Joint Exercises Ankle Circles/Pumps: 10 reps;Seated;Both Quad Sets: 10 reps;Seated;Right Heel Slides: 10 reps;Seated;Right Hip ABduction/ADduction: AROM;Right;10 reps;Seated Long Arc Quad: AAROM;Right;10 reps;Seated Goniometric ROM: knee AROM flexion to 80 degrees in sitting   PT Diagnosis:    PT Problem List:   PT Treatment Interventions:     PT Goals (current goals can now be found in the care plan section) Acute Rehab PT Goals Patient Stated Goal: to go home with mom PT Goal Formulation: With patient Time For Goal Achievement: 05/20/13 Potential to Achieve Goals: Good  Visit Information  Last PT Received On: 05/14/13 Assistance Needed: +1 History of Present Illness: s/p elective R TKA     Subjective Data  Subjective: pt sitting in chair; "ive been ready to walk" Patient Stated Goal: to go home with mom   Cognition  Cognition Arousal/Alertness: Awake/alert Behavior During Therapy: WFL for tasks assessed/performed Overall Cognitive Status: Within Functional Limits for tasks assessed    Balance  Balance Balance Assessed: No  End of Session PT - End of Session Equipment Utilized During Treatment: Gait belt Activity Tolerance: Patient tolerated treatment well Patient left: in chair;with call bell/phone within reach;with family/visitor present Nurse Communication: Mobility status   GP     Donell Sievert, [place] [phone] 05/14/2013, 12:56 PM

## 2013-05-14 NOTE — Progress Notes (Signed)
Patient ID: Alice Scott, female   DOB: 14-Feb-1951, 62 y.o.   MRN: [id number] Postoperative day 1 right total knee arthroplasty. Patient has been comfortable she has been out of bed to her bedside commode without problems. Physical therapy progressive ambulation weightbearing as tolerated.

## 2013-05-14 NOTE — Progress Notes (Signed)
Patient was ambulating to the bathroom with the NT and her knee gave out and she was eased to the floor by the NT at 1310. Two RNs and two NTs assisted patient off the floor to [facility] and then back to bed. Patient rating pain a 2 on a scale of 1-10. No SOB or dizziness noted. VS: 98.9, BP 119/46, O2 99% room air, HR 88. Dr. Lajoyce Corners was notified and is aware of fall no orders given. Will change dressing to assess incision. Patient currently stable, will continue to monitor.

## 2013-05-15 LAB — CBC
MCH: 32.8 pg (ref 26.0–34.0)
Platelets: 139 10*3/uL — ABNORMAL LOW (ref 150–400)
RBC: 3.14 MIL/uL — ABNORMAL LOW (ref 3.87–5.11)
RDW: 13.4 % (ref 11.5–15.5)

## 2013-05-15 MED ORDER — HYDROCODONE-ACETAMINOPHEN 5-325 MG PO TABS: 1.0000 | ORAL_TABLET | Freq: Four times a day (QID) | ORAL | Status: DC | PRN

## 2013-05-15 MED ORDER — ASPIRIN EC 325 MG PO TBEC: 325.0000 mg | DELAYED_RELEASE_TABLET | Freq: Every day | ORAL | Status: DC

## 2013-05-15 NOTE — Progress Notes (Signed)
Patient ID: Alice Scott, female   DOB: [birth year], 62 y.o.   MRN: [id number] Postoperative day 2 right total knee arthroplasty. Patient had one episode yesterday where she sat down to the floor she denies any pain denies any trauma. Incision is clean and dry. Anticipate working on stairs today plan for discharge to home tomorrow Saturday. Prescriptions on chart for Vicodin for pain she is to take aspirin for DVT prophylaxis plan to followup in the office in 2 weeks.

## 2013-05-15 NOTE — Progress Notes (Signed)
Physical Therapy Treatment Patient Details Name: Alice Scott MRN: [id number] DOB: 03/10/1951 Today's Date: 05/15/2013 Time: 9562-1308 PT Time Calculation (min): 30 min  PT Assessment / Plan / Recommendation  History of Present Illness s/p elective R TKA    Clinical Impression Pt making steady progress with mobility.  Increased ambulation distance & able to negotiate 3 steps.  Pt states plans are for her to d/c home tomorrow.  Will cont to follow acutely to maximize functional mobility.        Follow Up Recommendations  Home health PT;Supervision/Assistance - 24 hour     Does the patient have the potential to tolerate intense rehabilitation     Barriers to Discharge        Equipment Recommendations  3in1 (PT)    Recommendations for Other Services OT consult  Frequency 7X/week   Progress towards PT Goals Progress towards PT goals: Progressing toward goals  Plan Current plan remains appropriate    Precautions / Restrictions Precautions Precautions: Knee;Fall Restrictions RLE Weight Bearing: Weight bearing as tolerated Other Position/Activity Restrictions: no pillow under knee        Mobility  Bed Mobility Bed Mobility: Not assessed Transfers Transfers: Sit to Stand;Stand to Sit Sit to Stand: 5: Supervision;With upper extremity assist;With armrests;From toilet;From chair/3-in-1 Stand to Sit: 5: Supervision;With upper extremity assist;With armrests;To chair/3-in-1;To toilet Details for Transfer Assistance: Cues for use of UE's to control descent Ambulation/Gait Ambulation/Gait Assistance: 5: Supervision Ambulation Distance (Feet): 400 Feet Assistive device: Rolling walker Ambulation/Gait Assistance Details: Cues for increased heel strike RLE, increased stance time on RLE to ensure quad activation & promote increased WBing during LLE swing phase.   Gait Pattern: Step-through pattern;Decreased stride length;Decreased stance time - right;Decreased hip/knee flexion -  right;Antalgic Gait velocity: decreased Stairs: Yes Stairs Assistance: 4: Min guard Stair Management Technique: Two rails;Forwards;Step to pattern Number of Stairs: 3 Wheelchair Mobility Wheelchair Mobility: No    Exercises     PT Diagnosis:    PT Problem List:   PT Treatment Interventions:     PT Goals (current goals can now be found in the care plan section) Acute Rehab PT Goals Patient Stated Goal: to go home with mom PT Goal Formulation: With patient Time For Goal Achievement: 05/20/13 Potential to Achieve Goals: Good  Visit Information  Last PT Received On: 05/15/13 Assistance Needed: +1 History of Present Illness: s/p elective R TKA     Subjective Data  Patient Stated Goal: to go home with mom   Cognition  Cognition Arousal/Alertness: Awake/alert Behavior During Therapy: WFL for tasks assessed/performed Overall Cognitive Status: Within Functional Limits for tasks assessed    Balance  Balance Balance Assessed: No  End of Session PT - End of Session Equipment Utilized During Treatment: Gait belt Activity Tolerance: Patient tolerated treatment well Patient left: in chair;with call bell/phone within reach Nurse Communication: Mobility status   GP     Lara Mulch 05/15/2013, 9:37 AM   Verdell Face, PTA [phone] 05/15/2013

## 2013-05-15 NOTE — Discharge Summary (Signed)
Physician Discharge Summary  Patient ID: Alice Scott MRN: [id number] DOB/AGE: 1950/12/19 62 y.o.  Admit date: 05/13/2013 Discharge date: 05/15/2013  Admission Diagnoses: Osteoarthritis right knee  Discharge Diagnoses: Osteoarthritis right knee  Active Problems:   * No active hospital problems. *   Discharged Condition: stable  Hospital Course: Patient's hospital course was essentially unremarkable. She underwent total knee arthroplasty. She progressed well with therapy and was discharged to home in stable condition with home health therapy.  Consults: None  Significant Diagnostic Studies: labs: Routine labs  Treatments: surgery: See operative note  Discharge Exam: Blood pressure 111/57, pulse 83, temperature 98.7 F (37.1 C), temperature source Oral, resp. rate 16, SpO2 97.00%. Incision/Wound: incision clean dry and intact  Disposition: Final discharge disposition not confirmed  Discharge Orders   Future Orders Complete By Expires     Call MD / Call 911  As directed     Comments:      If you experience chest pain or shortness of breath, CALL 911 and be transported to the hospital emergency room.  If you develope a fever above 101 F, pus (white drainage) or increased drainage or redness at the wound, or calf pain, call your surgeon's office.    Constipation Prevention  As directed     Comments:      Drink plenty of fluids.  Prune juice may be helpful.  You may use a stool softener, such as Colace (over the counter) 100 mg twice a day.  Use MiraLax (over the counter) for constipation as needed.    Diet - low sodium heart healthy  As directed     Increase activity slowly as tolerated  As directed         Medication List         aspirin EC 325 MG tablet  Take 1 tablet (325 mg total) by mouth daily.     CALCIUM PO  Take 100 mg by mouth daily.     HYDROcodone-acetaminophen 5-325 MG per tablet  Commonly known as:  NORCO  Take 1 tablet by mouth every 6 (six) hours as  needed for pain.     levothyroxine 137 MCG tablet  Commonly known as:  SYNTHROID, LEVOTHROID  Take 137 mcg by mouth daily before breakfast.     lisinopril 10 MG tablet  Commonly known as:  PRINIVIL,ZESTRIL  Take 10 mg by mouth daily.     multivitamin with minerals Tabs  Take 1 tablet by mouth daily.     multivitamin-lutein Caps  Take 1 capsule by mouth daily.     omeprazole 40 MG capsule  Commonly known as:  PRILOSEC  Take 40 mg by mouth daily.     VITAMIN D PO  Take 2,000 Units by mouth daily.           Follow-up Information   Follow up with Janat Tabbert V, MD In 2 weeks.   Contact information:   [address] [place] [address] [phone]       Signed: Nadara Mustard 05/15/2013, 6:31 AM

## 2013-05-15 NOTE — Progress Notes (Signed)
05/15/13 Spoke with patient on 05/14/13 about HHC. She selected [facility]. Contacted Kazi Montoro with [facility] and set up HHPT. 3N1 delivered to patient 's room by T and T.Jaeshaun Riva Estrella Myrtle RN, BSN, CCM

## 2013-05-15 NOTE — Evaluation (Signed)
Occupational Therapy Evaluation Patient Details Name: Alice Scott MRN: [id number] DOB: July 07, 1951 Today's Date: 05/15/2013 Time: 4098-1191 OT Time Calculation (min): 25 min  OT Assessment / Plan / Recommendation History of present illness s/p elective R TKA    Clinical Impression   Patient evaluated by Occupational Therapy with no further acute OT needs identified. All education has been completed and the patient has no further questions. See below for any follow-up Occupational Therapy or equipment needs. OT is signing off. Thank you for this referral.     OT Assessment  Patient does not need any further OT services    Follow Up Recommendations  No OT follow up;Supervision - Intermittent    Barriers to Discharge      Equipment Recommendations  None recommended by OT    Recommendations for Other Services    Frequency       Precautions / Restrictions Precautions Precautions: Knee;Fall Restrictions RLE Weight Bearing: Weight bearing as tolerated Other Position/Activity Restrictions: no pillow under knee   Pertinent Vitals/Pain     ADL  Eating/Feeding: Independent Where Assessed - Eating/Feeding: Bed level Grooming: Wash/dry face;Wash/dry hands;Teeth care;Supervision/safety Where Assessed - Grooming: Unsupported standing Upper Body Bathing: Set up Where Assessed - Upper Body Bathing: Unsupported sitting Lower Body Bathing: Supervision/safety Where Assessed - Lower Body Bathing: Supported sit to stand Upper Body Dressing: Set up Where Assessed - Upper Body Dressing: Unsupported sitting Lower Body Dressing: Supervision/safety Where Assessed - Lower Body Dressing: Supported sit to stand Toilet Transfer: Supervision/safety Statistician Method: Sit to stand;Stand Wellsite geologist: Raised toilet seat with arms (or 3-in-1 over toilet) Toileting - Clothing Manipulation and Hygiene: Supervision/safety Where Assessed - Engineer, mining and  Hygiene: Standing Tub/Shower Transfer: Therapist, sports Method: Science writer: Shower seat without back Equipment Used: Rolling walker Transfers/Ambulation Related to ADLs: S ADL Comments: Pt able to don/doff socks with minimal effort.  Pt instructed in safe technique for tub transfer    OT Diagnosis:    OT Problem List:   OT Treatment Interventions:     OT Goals(Current goals can be found in the care plan section) Acute Rehab OT Goals Patient Stated Goal: to go home with mom  Visit Information  Last OT Received On: 05/15/13 Assistance Needed: +1 History of Present Illness: s/p elective R TKA        Prior Functioning     Home Living Family/patient expects to be discharged to:: Private residence Living Arrangements: Alone Available Help at Discharge: Family;Available 24 hours/day (is planning to D/C with mother ) Type of Home: House Home Layout: One level Home Equipment: Grab bars - tub/shower;Shower seat;Walker - 4 wheels;Other (comment);Bedside commode Additional Comments: Pt lives alone so is planning to D/C with mother; above is information for mother's house Prior Function Level of Independence: Independent Communication Communication: No difficulties Dominant Hand: Right         Vision/Perception     Cognition  Cognition Arousal/Alertness: Awake/alert Behavior During Therapy: WFL for tasks assessed/performed Overall Cognitive Status: Within Functional Limits for tasks assessed    Extremity/Trunk Assessment Upper Extremity Assessment Upper Extremity Assessment: Overall WFL for tasks assessed Lower Extremity Assessment Lower Extremity Assessment: Defer to PT evaluation Cervical / Trunk Assessment Cervical / Trunk Assessment: Normal     Mobility Bed Mobility Bed Mobility: Supine to Sit;Sitting - Scoot to Edge of Bed;Sit to Supine Supine to Sit: 6: Modified independent (Device/Increase time);HOB  flat Sitting - Scoot to Edge of Bed: 6: Modified independent (  Device/Increase time) Sit to Supine: 6: Modified independent (Device/Increase time);HOB flat Transfers Transfers: Sit to Stand;Stand to Sit Sit to Stand: 6: Modified independent (Device/Increase time);With upper extremity assist;From bed;From toilet Stand to Sit: 6: Modified independent (Device/Increase time);With upper extremity assist;To bed;To toilet     Exercise Total Joint Exercises Ankle Circles/Pumps: AROM;Both;15 reps Quad Sets: AROM;Both;10 reps Heel Slides: AROM;Strengthening;Right;10 reps Hip ABduction/ADduction: AROM;Strengthening;Right;10 reps Straight Leg Raises: AROM;Right;Strengthening;10 reps Long Arc Quad: AROM;Strengthening;Right;10 reps Knee Flexion: AROM;Right;10 reps   Balance Balance Balance Assessed: No   End of Session OT - End of Session Equipment Utilized During Treatment: Rolling walker Activity Tolerance: Patient tolerated treatment well Patient left: in bed;with call bell/phone within reach  GO     Lamarcus Spira M 05/15/2013, 3:30 PM

## 2013-05-15 NOTE — Progress Notes (Signed)
Physical Therapy Treatment Patient Details Name: Alice Scott MRN: [id number] DOB: 07-22-1951 Today's Date: 05/15/2013 Time: 1478-2956 PT Time Calculation (min): 26 min  PT Assessment / Plan / Recommendation  History of Present Illness s/p elective R TKA    Clinical Impression Pt very motivated & willing to participate in PT session.  Moving well.        Follow Up Recommendations  Home health PT;Supervision/Assistance - 24 hour     Does the patient have the potential to tolerate intense rehabilitation     Barriers to Discharge        Equipment Recommendations  3in1 (PT)    Recommendations for Other Services    Frequency 7X/week   Progress towards PT Goals Progress towards PT goals: Progressing toward goals  Plan Current plan remains appropriate    Precautions / Restrictions Precautions Precautions: Knee;Fall Restrictions Weight Bearing Restrictions: Yes RLE Weight Bearing: Weight bearing as tolerated Other Position/Activity Restrictions: no pillow under knee       Mobility  Bed Mobility Bed Mobility: Supine to Sit;Sitting - Scoot to Edge of Bed;Sit to Supine Supine to Sit: 6: Modified independent (Device/Increase time);HOB flat Sitting - Scoot to Edge of Bed: 6: Modified independent (Device/Increase time) Sit to Supine: 6: Modified independent (Device/Increase time);HOB flat Transfers Transfers: Sit to Stand;Stand to Sit Sit to Stand: 6: Modified independent (Device/Increase time);With upper extremity assist;From bed;From toilet Stand to Sit: 6: Modified independent (Device/Increase time);With upper extremity assist;To bed;To toilet Ambulation/Gait Ambulation/Gait Assistance: 5: Supervision Ambulation Distance (Feet): 220 Feet Assistive device: Rolling walker Ambulation/Gait Assistance Details: Supervision for safety.  encouragement to decrease reliance of UE's on RW & decrease trunk flexion forwards with LLE advancement.   Gait Pattern: Step-through  pattern;Decreased hip/knee flexion - right Stairs: No Wheelchair Mobility Wheelchair Mobility: No    Exercises Total Joint Exercises Ankle Circles/Pumps: AROM;Both;15 reps Quad Sets: AROM;Both;10 reps Heel Slides: AROM;Strengthening;Right;10 reps Hip ABduction/ADduction: AROM;Strengthening;Right;10 reps Straight Leg Raises: AROM;Right;Strengthening;10 reps Long Arc Quad: AROM;Strengthening;Right;10 reps Knee Flexion: AROM;Right;10 reps    PT Goals (current goals can now be found in the care plan section) Acute Rehab PT Goals Patient Stated Goal: to go home with mom PT Goal Formulation: With patient Time For Goal Achievement: 05/20/13 Potential to Achieve Goals: Good  Visit Information  Last PT Received On: 05/15/13 Assistance Needed: +1 History of Present Illness: s/p elective R TKA     Subjective Data  Patient Stated Goal: to go home with mom   Cognition  Cognition Arousal/Alertness: Awake/alert Behavior During Therapy: WFL for tasks assessed/performed Overall Cognitive Status: Within Functional Limits for tasks assessed    Balance  Balance Balance Assessed: No  End of Session PT - End of Session Equipment Utilized During Treatment: Gait belt Activity Tolerance: Patient tolerated treatment well Patient left: in bed;with call bell/phone within reach Nurse Communication: Mobility status   GP     Lara Mulch 05/15/2013, 1:49 PM    Verdell Face, PTA [phone] 05/15/2013

## 2013-05-16 LAB — CBC
HCT: 27.9 % — ABNORMAL LOW (ref 36.0–46.0)
MCHC: 34.4 g/dL (ref 30.0–36.0)
Platelets: 135 10*3/uL — ABNORMAL LOW (ref 150–400)
RDW: 13.2 % (ref 11.5–15.5)
WBC: 7.2 10*3/uL (ref 4.0–10.5)

## 2013-05-16 NOTE — Progress Notes (Signed)
Physical Therapy Treatment Patient Details Name: Alice Scott MRN: [id number] DOB: 09-06-1951 Today's Date: 05/16/2013 Time: 4098-1191 PT Time Calculation (min): 15 min  PT Assessment / Plan / Recommendation  History of Present Illness s/p elective R TKA        PT Comments   Patient progressing well. Anticipate DC home today  Follow Up Recommendations  Home health PT;Supervision/Assistance - 24 hour     Does the patient have the potential to tolerate intense rehabilitation     Barriers to Discharge        Equipment Recommendations  3in1 (PT)    Recommendations for Other Services    Frequency 7X/week   Progress towards PT Goals Progress towards PT goals: Progressing toward goals  Plan Current plan remains appropriate    Precautions / Restrictions Precautions Precautions: Knee Restrictions Weight Bearing Restrictions: Yes RLE Weight Bearing: Weight bearing as tolerated Other Position/Activity Restrictions: no pillow under knee   Pertinent Vitals/Pain no apparent distress     Mobility  Bed Mobility Bed Mobility: Not assessed Transfers Sit to Stand: 6: Modified independent (Device/Increase time);With upper extremity assist;From chair/3-in-1 Stand to Sit: 6: Modified independent (Device/Increase time);With upper extremity assist;To bed;To chair/3-in-1 Details for Transfer Assistance: Cues for use of UE's to control descent Ambulation/Gait Ambulation/Gait Assistance: 5: Supervision Ambulation Distance (Feet): 280 Feet Assistive device: Rolling walker Gait Pattern: Step-through pattern;Decreased hip/knee flexion - right    Exercises Total Joint Exercises Heel Slides: AROM;Strengthening;Right;10 reps Hip ABduction/ADduction: AROM;Strengthening;Right;10 reps Straight Leg Raises: AROM;Right;Strengthening;10 reps Long Arc Quad: AROM;Strengthening;Right;10 reps   PT Diagnosis:    PT Problem List:   PT Treatment Interventions:     PT Goals (current goals can now  be found in the care plan section)    Visit Information  Last PT Received On: 05/16/13 Assistance Needed: +1 History of Present Illness: s/p elective R TKA     Subjective Data      Cognition  Cognition Arousal/Alertness: Awake/alert Behavior During Therapy: WFL for tasks assessed/performed Overall Cognitive Status: Within Functional Limits for tasks assessed    Balance     End of Session PT - End of Session Equipment Utilized During Treatment: Gait belt Activity Tolerance: Patient tolerated treatment well Patient left: in bed;with call bell/phone within reach Nurse Communication: Mobility status   GP     Fredrich Birks 05/16/2013, 11:30 AM 05/16/2013 Fredrich Birks PTA [phone] pager [phone] office

## 2013-05-26 NOTE — Progress Notes (Signed)
Late Entry: SW received a consult for possible placement. PT At this time is recommending home with [facility] and not SNF. CSW will make CM aware. Clinical Social Worker will sign off for now as social work intervention is no longer needed. Please consult [place] again if new need arises.  Sabino Niemann, MSW  [phone]

## 2014-10-19 ENCOUNTER — Institutional Professional Consult (permissible substitution): Payer: 59 | Admitting: Pulmonary Disease

## 2017-01-14 ENCOUNTER — Encounter (INDEPENDENT_AMBULATORY_CARE_PROVIDER_SITE_OTHER): Payer: Self-pay | Admitting: Orthopedic Surgery

## 2017-01-14 ENCOUNTER — Ambulatory Visit (INDEPENDENT_AMBULATORY_CARE_PROVIDER_SITE_OTHER): Payer: Medicare HMO | Admitting: Orthopedic Surgery

## 2017-01-14 VITALS — Ht 62.0 in | Wt 210.0 lb

## 2017-01-14 DIAGNOSIS — M1712 Unilateral primary osteoarthritis, left knee: Secondary | ICD-10-CM

## 2017-01-16 DIAGNOSIS — M1712 Unilateral primary osteoarthritis, left knee: Secondary | ICD-10-CM

## 2017-01-16 MED ORDER — METHYLPREDNISOLONE ACETATE 40 MG/ML IJ SUSP
40.0000 mg | INTRAMUSCULAR | Status: AC | PRN
  Administered 2017-01-16: 40 mg via INTRA_ARTICULAR

## 2017-01-16 MED ORDER — LIDOCAINE HCL 1 % IJ SOLN
5.0000 mL | INTRAMUSCULAR | Status: AC | PRN
  Administered 2017-01-16: 5 mL

## 2017-01-16 NOTE — Progress Notes (Signed)
Office Visit Note   Patient: Alice Scott           Date of Birth: [birth year]           MRN: [id number] Visit Date: 01/14/2017              Requested by: Elizabeth Palau, FNP [address] [place], [place] [address] PCP: Elizabeth Palau, FNP  Chief Complaint  Patient presents with  . Left Knee - Pain    HPI: The patient is a 66 year old woman who presents today for evaluation of left knee pain. She complains of increased swelling over recent weeks. Pain with ambulation and flexion. States has had to have a fusion strand in the past wonders if this needs doing today. States has been told she needs a knee replacement on the left in the past but is not ready to proceed with this. No she has osteoarthritis. Of note she is status post total knee replacement on the right.  Assessment & Plan: Visit Diagnoses:  1. Primary osteoarthritis of left knee     Plan: Cortisone injection today. She'll follow-up in office in 4 weeks for continued pain.  Follow-Up Instructions: Return in about 4 weeks (around 02/11/2017), or if symptoms worsen or fail to improve.   Physical Exam  Constitutional: Appears well-developed.  Head: Normocephalic.  Eyes: EOM are normal.  Neck: Normal range of motion.  Cardiovascular: Normal rate.   Pulmonary/Chest: Effort normal.  Neurological: Is alert.  Skin: Skin is warm.  Psychiatric: Has a normal mood and affect. Does have an antalgic gait. Left Knee Exam   Tenderness  The patient is experiencing tenderness in the lateral joint line and medial joint line.  Range of Motion  The patient has normal left knee ROM.  Muscle Strength   The patient has normal left knee strength.  Tests  Varus: negative Valgus: negative  Other  Erythema: absent Swelling: moderate        Imaging: No results found.  Labs: No results found for: HGBA1C, ESRSEDRATE, CRP, LABURIC, REPTSTATUS, GRAMSTAIN, CULT, LABORGA  Orders:  No orders of the  defined types were placed in this encounter.  No orders of the defined types were placed in this encounter.    Procedures: Large Joint Inj Date/Time: 01/16/2017 8:10 AM Performed by: Adonis Huguenin Authorized by: Barnie Del R   Consent Given by:  Patient Site marked: the procedure site was marked   Timeout: prior to procedure the correct patient, procedure, and site was verified   Indications:  Pain and diagnostic evaluation Location:  Knee Site:  L knee Needle Size:  22 G Needle Length:  1.5 inches Ultrasound Guidance: No   Fluoroscopic Guidance: No   Arthrogram: No   Medications:  5 mL lidocaine 1 %; 40 mg methylPREDNISolone acetate 40 MG/ML Aspiration Attempted: Yes   Aspirate amount (mL):  0 Patient tolerance:  Patient tolerated the procedure well with no immediate complications    Clinical Data: No additional findings.  ROS:  Review of Systems  Constitutional: Negative for chills and fever.  Musculoskeletal: Positive for arthralgias, gait problem and joint swelling.    Objective: Vital Signs: Ht 5\' 2"  (1.575 m)   Wt 210 lb (95.3 kg)   BMI 38.41 kg/m   Specialty Comments:  No specialty comments available.  PMFS History: There are no active problems to display for this patient.  Past Medical History:  Diagnosis Date  . Anemia   . Arthritis   . GERD (  gastroesophageal reflux disease)   . Gout   . Hypothyroidism     No family history on file.  Past Surgical History:  Procedure Laterality Date  . ABDOMINAL HYSTERECTOMY    . APPENDECTOMY    . arthroscopic knee Right   . CHOLECYSTECTOMY    . GASTRIC BYPASS  2004  . TOTAL KNEE ARTHROPLASTY Right 05/13/2013   Procedure: TOTAL KNEE ARTHROPLASTY;  Surgeon: Nadara MustardMarcus V Duda, MD;  Location: [facility] OR;  Service: Orthopedics;  Laterality: Right;  Right Total Knee Arthroplasty   Social History   Occupational History  . Not on file.   Social History Main Topics  . Smoking status: Never Smoker  . Smokeless  tobacco: Never Used  . Alcohol use No  . Drug use: No  . Sexual activity: Not on file

## 2017-01-16 NOTE — Addendum Note (Signed)
Addended by: Adonis HugueninZAMORA, ERIN R on: 01/16/2017 08:12 AM   Modules accepted: Orders

## 2017-02-11 ENCOUNTER — Ambulatory Visit (INDEPENDENT_AMBULATORY_CARE_PROVIDER_SITE_OTHER): Payer: Medicare HMO | Admitting: Orthopedic Surgery

## 2017-03-15 ENCOUNTER — Ambulatory Visit (INDEPENDENT_AMBULATORY_CARE_PROVIDER_SITE_OTHER): Payer: Medicare HMO

## 2017-03-15 ENCOUNTER — Ambulatory Visit (INDEPENDENT_AMBULATORY_CARE_PROVIDER_SITE_OTHER): Payer: Medicare HMO | Admitting: Family

## 2017-03-15 ENCOUNTER — Encounter (INDEPENDENT_AMBULATORY_CARE_PROVIDER_SITE_OTHER): Payer: Self-pay | Admitting: Family

## 2017-03-15 VITALS — Ht 62.0 in | Wt 210.0 lb

## 2017-03-15 DIAGNOSIS — M1712 Unilateral primary osteoarthritis, left knee: Secondary | ICD-10-CM | POA: Diagnosis not present

## 2017-03-15 DIAGNOSIS — Z96651 Presence of right artificial knee joint: Secondary | ICD-10-CM

## 2017-03-15 DIAGNOSIS — M25561 Pain in right knee: Secondary | ICD-10-CM | POA: Diagnosis not present

## 2017-03-15 DIAGNOSIS — G8929 Other chronic pain: Secondary | ICD-10-CM | POA: Diagnosis not present

## 2017-03-19 ENCOUNTER — Telehealth (INDEPENDENT_AMBULATORY_CARE_PROVIDER_SITE_OTHER): Payer: Self-pay

## 2017-03-19 NOTE — Telephone Encounter (Signed)
Verification of benefits sent to [employer] this morning. Will hold message in box pending approval.

## 2017-03-21 NOTE — Telephone Encounter (Signed)
Scheduled for tomorrow at 2:15 with Dr. Lajoyce Cornersuda

## 2017-03-21 NOTE — Telephone Encounter (Signed)
[employer] approved can you please call pt to set up appt with erin or Dr. Lajoyce Cornersduda please? Thanks

## 2017-03-22 ENCOUNTER — Encounter (INDEPENDENT_AMBULATORY_CARE_PROVIDER_SITE_OTHER): Payer: Self-pay | Admitting: Family

## 2017-03-22 ENCOUNTER — Ambulatory Visit (INDEPENDENT_AMBULATORY_CARE_PROVIDER_SITE_OTHER): Payer: Medicare HMO | Admitting: Family

## 2017-03-22 VITALS — Ht 62.0 in | Wt 210.0 lb

## 2017-03-22 DIAGNOSIS — M1712 Unilateral primary osteoarthritis, left knee: Secondary | ICD-10-CM

## 2017-03-22 MED ORDER — LIDOCAINE HCL 1 % IJ SOLN
1.0000 mL | INTRAMUSCULAR | Status: AC | PRN
  Administered 2017-03-22: 1 mL

## 2017-03-22 MED ORDER — HYALURONAN 88 MG/4ML IX SOSY
88.0000 mg | PREFILLED_SYRINGE | INTRA_ARTICULAR | Status: AC | PRN
  Administered 2017-03-22: 88 mg via INTRA_ARTICULAR

## 2017-03-22 NOTE — Progress Notes (Signed)
Office Visit Note   Patient: Alice MeringKaren S Mattia           Date of Birth: 1951-03-03           MRN: [id number] Visit Date: 03/22/2017              Requested by: Elizabeth PalauAnderson, Teresa, FNP [address] [place], [place] [id number] PCP: Elizabeth PalauAnderson, Teresa, FNP  Chief Complaint  Patient presents with  . Right Knee - Follow-up    monovisc injection    HPI: The patient is a 66 year old woman who presents today for hyaluronic acid injection left knee pain.  Pain with ambulation and flexion. Ongoing pain and issues from osteoarthritis left knee. Has had minimal relief from Depomedrol injection left knee.   Is status post TKA, right knee.  Assessment & Plan: Visit Diagnoses:  1. Primary osteoarthritis of left knee     Plan: Hyaluronic acid injection today.  Follow-Up Instructions: Return if symptoms worsen or fail to improve.   Physical Exam  Constitutional: Appears well-developed.  Head: Normocephalic.  Eyes: EOM are normal.  Neck: Normal range of motion.  Cardiovascular: Normal rate.   Pulmonary/Chest: Effort normal.  Neurological: Is alert.  Skin: Skin is warm.  Psychiatric: Has a normal mood and affect. Does have an antalgic gait. Left Knee Exam   Tenderness  The patient is experiencing tenderness in the lateral joint line and medial joint line.  Range of Motion  The patient has normal left knee ROM.  Muscle Strength   The patient has normal left knee strength.  Tests  Varus: negative Valgus: negative  Other  Erythema: absent Swelling: moderate        Imaging: No results found.  Labs: No results found for: HGBA1C, ESRSEDRATE, CRP, LABURIC, REPTSTATUS, GRAMSTAIN, CULT, LABORGA  Orders:  No orders of the defined types were placed in this encounter.  No orders of the defined types were placed in this encounter.    Procedures: Large Joint Inj Date/Time: 03/22/2017 2:40 PM Performed by: Barnie DelZAMORA, ERIN R Authorized by: Barnie DelZAMORA, ERIN R    Consent Given by:  Patient Site marked: the procedure site was marked   Timeout: prior to procedure the correct patient, procedure, and site was verified   Indications:  Pain and diagnostic evaluation Location:  Knee Site:  L knee Needle Size:  22 G Needle Length:  1.5 inches Ultrasound Guidance: No   Fluoroscopic Guidance: No   Arthrogram: No   Medications:  1 mL lidocaine 1 %; 88 mg Hyaluronan 88 MG/4ML Aspiration Attempted: No   Patient tolerance:  Patient tolerated the procedure well with no immediate complications    Clinical Data: No additional findings.  ROS:  Review of Systems  Constitutional: Negative for chills and fever.  Musculoskeletal: Positive for arthralgias, gait problem and joint swelling.    Objective: Vital Signs: Ht 5\' 2"  (1.575 m)   Wt 210 lb (95.3 kg)   BMI 38.41 kg/m   Specialty Comments:  No specialty comments available.  PMFS History: There are no active problems to display for this patient.  Past Medical History:  Diagnosis Date  . Anemia   . Arthritis   . GERD (gastroesophageal reflux disease)   . Gout   . Hypothyroidism     No family history on file.  Past Surgical History:  Procedure Laterality Date  . ABDOMINAL HYSTERECTOMY    . APPENDECTOMY    . arthroscopic knee Right   . CHOLECYSTECTOMY    . GASTRIC  BYPASS  2004  . TOTAL KNEE ARTHROPLASTY Right 05/13/2013   Procedure: TOTAL KNEE ARTHROPLASTY;  Surgeon: Nadara Mustard, MD;  Location: [facility];  Service: Orthopedics;  Laterality: Right;  Right Total Knee Arthroplasty   Social History   Occupational History  . Not on file.   Social History Main Topics  . Smoking status: Never Smoker  . Smokeless tobacco: Never Used  . Alcohol use No  . Drug use: No  . Sexual activity: Not on file

## 2017-03-26 DIAGNOSIS — Z96651 Presence of right artificial knee joint: Secondary | ICD-10-CM | POA: Insufficient documentation

## 2017-03-26 NOTE — Progress Notes (Signed)
Office Visit Note   Patient: Alice Scott           Date of Birth: 1951/05/30           MRN: [id number] Visit Date: 03/15/2017              Requested by: Elizabeth PalauAnderson, Teresa, FNP [address] [place], [place] [id number] PCP: Elizabeth PalauAnderson, Teresa, FNP  Chief Complaint  Patient presents with  . Right Knee - Pain    05/13/13 right total knee replacement  . Left Knee - Pain    S/p injection 01/14/17      HPI: The patient is a 66 year old woman who presents today for 2 separate issues. She is complaining of left and right knee pain. The left knee she complains of buckling intermittently this is been ongoing since earlier this year did have a Depo-Medrol injection on March 26. This provided minimal relief. Complaining of aching pain this is worse with ambulation. The right knee she did have a total knee arthroplasty on 05/13/2013. Did have a recent fall about a year ago where she fell directly on this knee she's concerned she's continued to have lateral burning pain in this knee wonders if everything is okay with her knee replacement. Complains of some lateral numbness this is been ongoing since joint replacement.  Assessment & Plan: Visit Diagnoses:  1. Chronic pain of right knee   2. H/O total knee replacement, right   3. Primary osteoarthritis of left knee     Plan: Reassurance provided read the right knee numbness and pain. We will get her set up for Monovisc injection left knee. She'll return for this injection.  Follow-Up Instructions: Return for Monovisc.   Right Knee Exam   Tenderness  The patient is experiencing no tenderness.     Range of Motion  The patient has normal right knee ROM.  Tests  Varus: negative Valgus: negative  Other  Erythema: absent Other tests: no effusion present   Left Knee Exam   Tenderness  The patient is experiencing tenderness in the medial joint line.  Range of Motion  The patient has normal left knee ROM.  Tests    Varus: negative Valgus: negative  Other  Erythema: absent Effusion: no effusion present  Comments:  Does have crepitation with range of motion      Patient is alert, oriented, no adenopathy, well-dressed, normal affect, normal respiratory effort.   Imaging: No results found.  Labs: No results found for: HGBA1C, ESRSEDRATE, CRP, LABURIC, REPTSTATUS, GRAMSTAIN, CULT, LABORGA  Orders:  Orders Placed This Encounter  Procedures  . XR Knee 1-2 Views Right   No orders of the defined types were placed in this encounter.    Procedures: No procedures performed  Clinical Data: No additional findings.  ROS:  All other systems negative, except as noted in the HPI. Review of Systems  Constitutional: Negative for chills and fever.  Musculoskeletal: Positive for arthralgias. Negative for joint swelling.    Objective: Vital Signs: Ht 5\' 2"  (1.575 m)   Wt 210 lb (95.3 kg)   BMI 38.41 kg/m   Specialty Comments:  No specialty comments available.  PMFS History: Patient Active Problem List   Diagnosis Date Noted  . H/O total knee replacement, 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   Past Medical History:  Diagnosis Date  . Anemia   . Arthritis   . GERD (gastroesophageal reflux disease)   . Gout   . Hypothyroidism     No family history  on file.  Past Surgical History:  Procedure Laterality Date  . ABDOMINAL HYSTERECTOMY    . APPENDECTOMY    . arthroscopic knee Right   . CHOLECYSTECTOMY    . GASTRIC BYPASS  2004  . TOTAL KNEE ARTHROPLASTY Right 05/13/2013   Procedure: TOTAL KNEE ARTHROPLASTY;  Surgeon: Nadara Mustard, MD;  Location: [facility];  Service: Orthopedics;  Laterality: Right;  Right Total Knee Arthroplasty   Social History   Occupational History  . Not on file.   Social History Main Topics  . Smoking status: Never Smoker  . Smokeless tobacco: Never Used  . Alcohol use No  . Drug use: No  . Sexual activity: Not on file

## 2021-09-07 ENCOUNTER — Ambulatory Visit (INDEPENDENT_AMBULATORY_CARE_PROVIDER_SITE_OTHER): Payer: Medicare HMO

## 2021-09-07 ENCOUNTER — Ambulatory Visit (INDEPENDENT_AMBULATORY_CARE_PROVIDER_SITE_OTHER): Payer: Medicare HMO | Admitting: Orthopedic Surgery

## 2021-09-07 ENCOUNTER — Other Ambulatory Visit: Payer: Self-pay

## 2021-09-07 DIAGNOSIS — G8929 Other chronic pain: Secondary | ICD-10-CM | POA: Diagnosis not present

## 2021-09-07 DIAGNOSIS — M25562 Pain in left knee: Secondary | ICD-10-CM

## 2021-09-25 ENCOUNTER — Encounter: Payer: Self-pay | Admitting: Orthopedic Surgery

## 2021-09-25 DIAGNOSIS — M25562 Pain in left knee: Secondary | ICD-10-CM | POA: Diagnosis not present

## 2021-09-25 DIAGNOSIS — G8929 Other chronic pain: Secondary | ICD-10-CM

## 2021-09-25 MED ORDER — LIDOCAINE HCL (PF) 1 % IJ SOLN
5.0000 mL | INTRAMUSCULAR | Status: AC | PRN
  Administered 2021-09-25: 5 mL

## 2021-09-25 MED ORDER — METHYLPREDNISOLONE ACETATE 40 MG/ML IJ SUSP
40.0000 mg | INTRAMUSCULAR | Status: AC | PRN
  Administered 2021-09-25: 40 mg via INTRA_ARTICULAR

## 2021-09-25 NOTE — Progress Notes (Signed)
Office Visit Note   Patient: Alice Scott           Date of Birth: 04-10-1951           MRN: [id number] Visit Date: 09/07/2021              Requested by: Elizabeth Palau, FNP [address] [place],  [place] [address] PCP: Elizabeth Palau, FNP  Chief Complaint  Patient presents with   Left Knee - Pain      HPI: Patient is a 70 year old woman who presents she is status post right total knee in 2009 with bone-on-bone pain of her left knee.  She states she has decreased range of motion and swelling she would like to consider delaying total joint replacement till after the first of the year.  Assessment & Plan: Visit Diagnoses:  1. Chronic pain of left knee     Plan: Patient underwent an intra-articular injection for the left knee.  We will reevaluate after the first year for total joint arthroplasty on the left.  Patient most likely will need a cemented implant.  Follow-Up Instructions: Return if symptoms worsen or fail to improve.   Ortho Exam  Patient is alert, oriented, no adenopathy, well-dressed, normal affect, normal respiratory effort. Examination patient has a stable right total knee without problems.  Examination of the left knee she has mild varus alignment with tenderness to palpation in all 3 compartments there is crepitation with range of motion.  Imaging: No results found. No images are attached to the encounter.  Labs: No results found for: HGBA1C, ESRSEDRATE, CRP, LABURIC, REPTSTATUS, GRAMSTAIN, CULT, LABORGA   Lab Results  Component Value Date   ALBUMIN 3.8 05/01/2013    No results found for: MG No results found for: VD25OH  No results found for: PREALBUMIN CBC EXTENDED Latest Ref Rng & Units 05/16/2013 05/15/2013 05/14/2013  WBC 4.0 - 10.5 K/uL 7.2 7.7 9.1  RBC 3.87 - 5.11 MIL/uL 2.93(L) 3.14(L) 3.45(L)  HGB 12.0 - 15.0 g/dL 8.4(O) 10.3(L) 11.1(L)  HCT 36.0 - 46.0 % 27.9(L) 30.0(L) 32.9(L)  PLT 150 - 400 K/uL 135(L) 139(L) 165      There is no height or weight on file to calculate BMI.  Orders:  Orders Placed This Encounter  Procedures   XR Knee 1-2 Views Left   No orders of the defined types were placed in this encounter.    Procedures: Large Joint Inj: L knee on 09/25/2021 12:08 PM Indications: pain and diagnostic evaluation Details: 22 G 1.5 in needle, anteromedial approach  Arthrogram: No  Medications: 5 mL lidocaine (PF) 1 %; 40 mg methylPREDNISolone acetate 40 MG/ML Outcome: tolerated well, no immediate complications Procedure, treatment alternatives, risks and benefits explained, specific risks discussed. Consent was given by the patient. Immediately prior to procedure a time out was called to verify the correct patient, procedure, equipment, support staff and site/side marked as required. Patient was prepped and draped in the usual sterile fashion.     Clinical Data: No additional findings.  ROS:  All other systems negative, except as noted in the HPI. Review of Systems  Objective: Vital Signs: There were no vitals taken for this visit.  Specialty Comments:  No specialty comments available.  PMFS History: Patient Active Problem List   Diagnosis Date Noted   H/O total knee replacement, 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   Past Medical History:  Diagnosis Date   Anemia    Arthritis    GERD (gastroesophageal reflux disease)  Gout    Hypothyroidism     History reviewed. No pertinent family history.  Past Surgical History:  Procedure Laterality Date   ABDOMINAL HYSTERECTOMY     APPENDECTOMY     arthroscopic knee Right    CHOLECYSTECTOMY     GASTRIC BYPASS  2004   TOTAL KNEE ARTHROPLASTY Right 05/13/2013   Procedure: TOTAL KNEE ARTHROPLASTY;  Surgeon: Nadara Mustard, MD;  Location: [facility] OR;  Service: Orthopedics;  Laterality: Right;  Right Total Knee Arthroplasty   Social History   Occupational History   Not on file  Tobacco Use   Smoking status: Never   Smokeless tobacco: Never   Substance and Sexual Activity   Alcohol use: No   Drug use: No   Sexual activity: Not on file

## 2021-12-18 ENCOUNTER — Ambulatory Visit (INDEPENDENT_AMBULATORY_CARE_PROVIDER_SITE_OTHER): Payer: Medicare HMO | Admitting: Orthopedic Surgery

## 2021-12-18 DIAGNOSIS — M25562 Pain in left knee: Secondary | ICD-10-CM | POA: Diagnosis not present

## 2021-12-18 DIAGNOSIS — G8929 Other chronic pain: Secondary | ICD-10-CM

## 2021-12-18 DIAGNOSIS — M1712 Unilateral primary osteoarthritis, left knee: Secondary | ICD-10-CM

## 2021-12-19 ENCOUNTER — Encounter: Payer: Self-pay | Admitting: Orthopedic Surgery

## 2021-12-19 NOTE — Progress Notes (Signed)
Office Visit Note   Patient: Alice Scott           Date of Birth: 1951-02-20           MRN: [id number] Visit Date: 12/18/2021              Requested by: Elizabeth Palau, FNP [address] [place],  [place] [address] PCP: Elizabeth Palau, FNP  Chief Complaint  Patient presents with   Left Knee - Follow-up      HPI: Patient is a 71 year old woman with chronic osteoarthritis of her left knee.  Patient is status post a right total knee arthroplasty.  Patient has mechanical symptoms and pain with activities of daily living.  Her last steroid injection for her left knee was September 25, 2021.  Right total knee arthroplasty in 2009.  Assessment & Plan: Visit Diagnoses:  1. Chronic pain of left knee   2. Unilateral primary osteoarthritis, left knee     Plan: Patient would like to proceed with total knee arthroplasty on the left at this time.  Risk and benefits were discussed postoperative hospital stay was discussed patient anticipates that she could discharge to home in a day or 2 after surgery.  Follow-Up Instructions: Return in about 2 weeks (around 01/01/2022).   Ortho Exam  Patient is alert, oriented, no adenopathy, well-dressed, normal affect, normal respiratory effort. Examination patient has crepitation with range of motion of the left knee she has pain to palpation of the medial lateral joint line collaterals and cruciates are stable.  There is no significant varus or valgus malalignment.  Review of the radiographs shows bone-on-bone contact of the medial joint line with periarticular bony spurs in all 3 compartments with subchondral cysts and sclerosis.  Imaging: No results found. No images are attached to the encounter.  Labs: No results found for: HGBA1C, ESRSEDRATE, CRP, LABURIC, REPTSTATUS, GRAMSTAIN, CULT, LABORGA   Lab Results  Component Value Date   ALBUMIN 3.8 05/01/2013    No results found for: MG No results found for: VD25OH  No  results found for: PREALBUMIN CBC EXTENDED Latest Ref Rng & Units 05/16/2013 05/15/2013 05/14/2013  WBC 4.0 - 10.5 K/uL 7.2 7.7 9.1  RBC 3.87 - 5.11 MIL/uL 2.93(L) 3.14(L) 3.45(L)  HGB 12.0 - 15.0 g/dL 3.2(I) 10.3(L) 11.1(L)  HCT 36.0 - 46.0 % 27.9(L) 30.0(L) 32.9(L)  PLT 150 - 400 K/uL 135(L) 139(L) 165     There is no height or weight on file to calculate BMI.  Orders:  No orders of the defined types were placed in this encounter.  No orders of the defined types were placed in this encounter.    Procedures: No procedures performed  Clinical Data: No additional findings.  ROS:  All other systems negative, except as noted in the HPI. Review of Systems  Objective: Vital Signs: There were no vitals taken for this visit.  Specialty Comments:  No specialty comments available.  PMFS History: Patient Active Problem List   Diagnosis Date Noted   H/O total knee replacement, 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   Past Medical History:  Diagnosis Date   Anemia    Arthritis    GERD (gastroesophageal reflux disease)    Gout    Hypothyroidism     History reviewed. No pertinent family history.  Past Surgical History:  Procedure Laterality Date   ABDOMINAL HYSTERECTOMY     APPENDECTOMY     arthroscopic knee Right    CHOLECYSTECTOMY     GASTRIC BYPASS  2004   TOTAL KNEE ARTHROPLASTY Right 05/13/2013   Procedure: TOTAL KNEE ARTHROPLASTY;  Surgeon: Nadara Mustard, MD;  Location: [facility];  Service: Orthopedics;  Laterality: Right;  Right Total Knee Arthroplasty   Social History   Occupational History   Not on file  Tobacco Use   Smoking status: Never   Smokeless tobacco: Never  Substance and Sexual Activity   Alcohol use: No   Drug use: No   Sexual activity: Not on file

## 2022-01-08 ENCOUNTER — Ambulatory Visit (INDEPENDENT_AMBULATORY_CARE_PROVIDER_SITE_OTHER): Payer: Medicare HMO | Admitting: Orthopedic Surgery

## 2022-01-08 DIAGNOSIS — M1712 Unilateral primary osteoarthritis, left knee: Secondary | ICD-10-CM

## 2022-01-09 ENCOUNTER — Encounter: Payer: Self-pay | Admitting: Orthopedic Surgery

## 2022-01-09 NOTE — Progress Notes (Signed)
? ?Office Visit Note ?  ?Patient: Alice Scott           ?Date of Birth: October 27, 1950           ?MRN: [id number] ?Visit Date: 01/08/2022 ?             ?Requested by: Elizabeth Palau, FNP [phone] ?[place],  [place] [address] ?PCP: Elizabeth Palau, FNP ? ?Chief Complaint  ?Patient presents with  ? Left Knee - Follow-up  ? ? ? ? ?HPI: ?Patient is a 71 year old woman who has chronic osteoarthritis of her left knee she has undergone steroid injections without relief.  Patient states she also has bulging disks and spurs in her cervical spine and is undergoing chiropractic manipulation for this. ? ?Patient states she does have a history of osteoporosis. ? ?Assessment & Plan: ?Visit Diagnoses:  ?1. Unilateral primary osteoarthritis, left knee   ? ? ?Plan: With patient's history of osteoporosis we will plan for cemented total knee arthroplasty.  Patient has a trip in May and would like to proceed with surgery after her trip.  Risk and benefits were discussed patient states she understands wished to proceed at this time.  Patient has undergone both chiropractic manipulation as well as seen a spine specialist for her neck.  Discussed that she could try heat and traction. ? ?Follow-Up Instructions: Return in about 2 weeks (around 01/22/2022).  ? ?Ortho Exam ? ?Patient is alert, oriented, no adenopathy, well-dressed, normal affect, normal respiratory effort. ?Examination patient has no focal motor weakness in either upper extremity.  Thoracic outlet is nontender.  Examination of the left knee patient has tenderness to palpation of the medial lateral joint line there is crepitation with range of motion collaterals and cruciates are stable.  The previous radiograph of the left knee shows bone-on-bone contact medial joint line with a stable right total knee arthroplasty. ? ?Imaging: ?No results found. ?No images are attached to the encounter. ? ?Labs: ?No results found for: HGBA1C, ESRSEDRATE, CRP, LABURIC,  REPTSTATUS, GRAMSTAIN, CULT, LABORGA ? ? ?Lab Results  ?Component Value Date  ? ALBUMIN 3.8 05/01/2013  ? ? ?No results found for: MG ?No results found for: VD25OH ? ?No results found for: PREALBUMIN ?CBC EXTENDED Latest Ref Rng & Units 05/16/2013 05/15/2013 05/14/2013  ?WBC 4.0 - 10.5 K/uL 7.2 7.7 9.1  ?RBC 3.87 - 5.11 MIL/uL 2.93(L) 3.14(L) 3.45(L)  ?HGB 12.0 - 15.0 g/dL 7.4(B) 10.3(L) 11.1(L)  ?HCT 36.0 - 46.0 % 27.9(L) 30.0(L) 32.9(L)  ?PLT 150 - 400 K/uL 135(L) 139(L) 165  ? ? ? ?There is no height or weight on file to calculate BMI. ? ?Orders:  ?No orders of the defined types were placed in this encounter. ? ?No orders of the defined types were placed in this encounter. ? ? ? Procedures: ?No procedures performed ? ?Clinical Data: ?No additional findings. ? ?ROS: ? ?All other systems negative, except as noted in the HPI. ?Review of Systems ? ?Objective: ?Vital Signs: There were no vitals taken for this visit. ? ?Specialty Comments:  ?No specialty comments available. ? ?PMFS History: ?Patient Active Problem List  ? Diagnosis Date Noted  ? 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 ?Past Medical History:  ?Diagnosis Date  ? Anemia   ? Arthritis   ? GERD (gastroesophageal reflux disease)   ? Gout   ? Hypothyroidism   ?  ?History reviewed. No pertinent family history.  ?Past Surgical History:  ?Procedure Laterality Date  ? ABDOMINAL HYSTERECTOMY    ?  APPENDECTOMY    ? arthroscopic knee Right   ? CHOLECYSTECTOMY    ? GASTRIC BYPASS  2004  ? TOTAL KNEE ARTHROPLASTY Right 05/13/2013  ? Procedure: TOTAL KNEE ARTHROPLASTY;  Surgeon: Nadara Mustard, MD;  Location: [facility] OR;  Service: Orthopedics;  Laterality: Right;  Right Total Knee Arthroplasty  ? ?Social History  ? ?Occupational History  ? Not on file  ?Tobacco Use  ? Smoking status: Never  ? Smokeless tobacco: Never  ?Substance and Sexual Activity  ? Alcohol use: No  ? Drug use: No  ? Sexual activity: Not on file  ? ? ? ? ? ?

## 2022-01-12 ENCOUNTER — Telehealth: Payer: Self-pay | Admitting: Orthopedic Surgery

## 2022-01-12 NOTE — Telephone Encounter (Signed)
Please schedule Surgery for after 5-21 -thanks ?

## 2022-02-22 ENCOUNTER — Telehealth: Payer: Self-pay | Admitting: Orthopedic Surgery

## 2022-02-22 NOTE — Telephone Encounter (Signed)
Mrs. Thackston needs a note for work stating how she is to be expected to be out of work following her knee replacement. Please call when letter is ready and she will come into office to pick up. ?

## 2022-02-23 ENCOUNTER — Other Ambulatory Visit: Payer: Self-pay

## 2022-02-23 ENCOUNTER — Emergency Department (HOSPITAL_BASED_OUTPATIENT_CLINIC_OR_DEPARTMENT_OTHER): Payer: Medicare HMO

## 2022-02-23 ENCOUNTER — Encounter (HOSPITAL_BASED_OUTPATIENT_CLINIC_OR_DEPARTMENT_OTHER): Payer: Self-pay | Admitting: Emergency Medicine

## 2022-02-23 ENCOUNTER — Emergency Department (HOSPITAL_BASED_OUTPATIENT_CLINIC_OR_DEPARTMENT_OTHER)
Admission: EM | Admit: 2022-02-23 | Discharge: 2022-02-23 | Disposition: A | Discharge status: Home / Self Care | Payer: Medicare HMO | Attending: Emergency Medicine | Admitting: Emergency Medicine

## 2022-02-23 DIAGNOSIS — W109XXA Fall (on) (from) unspecified stairs and steps, initial encounter: Secondary | ICD-10-CM | POA: Insufficient documentation

## 2022-02-23 DIAGNOSIS — S62614A Displaced fracture of proximal phalanx of right ring finger, initial encounter for closed fracture: Secondary | ICD-10-CM | POA: Diagnosis not present

## 2022-02-23 DIAGNOSIS — S62616A Displaced fracture of proximal phalanx of right little finger, initial encounter for closed fracture: Secondary | ICD-10-CM | POA: Insufficient documentation

## 2022-02-23 DIAGNOSIS — Z7982 Long term (current) use of aspirin: Secondary | ICD-10-CM | POA: Diagnosis not present

## 2022-02-23 DIAGNOSIS — S6991XA Unspecified injury of right wrist, hand and finger(s), initial encounter: Secondary | ICD-10-CM | POA: Diagnosis present

## 2022-02-23 DIAGNOSIS — S62619A Displaced fracture of proximal phalanx of unspecified finger, initial encounter for closed fracture: Secondary | ICD-10-CM

## 2022-02-23 DIAGNOSIS — M79641 Pain in right hand: Secondary | ICD-10-CM | POA: Diagnosis present

## 2022-02-23 NOTE — Discharge Instructions (Signed)
You were seen in the emergency department for evaluation of 2 finger fractures from a fall.  You were placed in a splint and will need to contact Dr. Madelynn Done office to be seen next week.  Keep the splint on clean and dry.  Tylenol as needed for pain.  Elevate.  Return if any worsening or concerning symptoms. ?

## 2022-02-23 NOTE — ED Provider Notes (Signed)
?[facility] ?Provider Note ? ? ?CSN: [id number] ?Arrival date & time: 02/23/22  1705 ? ?  ? ?History ? ?Chief Complaint  ?Patient presents with  ? Fall  ? ? ?Alice Scott is a 71 y.o. female.  She is right-hand dominant.  She is complaining of right hand pain after a mechanical fall going [place] some steps from the [place].  This occurred about 2 hours ago.  She denies any loss consciousness did not strike her head or neck.  She is not on any blood thinners.  She went to urgent care where they x-rayed her and found her to have a fracture and dislocation.  No other injuries or complaints.  She does not use a cane or a walker.  She sees Dr. Sharol Given from Ortho care and is going of a knee replacement soon. ? ?The history is provided by the patient.  ?Hand Injury ?Location:  Hand and finger ?Finger location:  R middle finger and R ring finger ?Injury: yes   ?Time since incident:  2 hours ?Mechanism of injury: fall   ?Fall:  ?  Fall occurred:  Tripped ?  Impact surface:  Stairs ?Pain details:  ?  Quality:  Throbbing ?  Severity:  Moderate ?  Onset quality:  Sudden ?  Timing:  Constant ?  Progression:  Unchanged ?Handedness:  Right-handed ?Prior injury to area:  No ?Relieved by:  None tried ?Worsened by:  Movement ?Ineffective treatments:  None tried ?Associated symptoms: decreased range of motion   ?Associated symptoms: no back pain, no fever, no neck pain, no numbness and no tingling   ? ?  ? ?Home Medications ?Prior to Admission medications   ?Medication Sig Start Date End Date Taking? Authorizing Provider  ?aspirin EC 325 MG tablet Take 1 tablet (325 mg total) by mouth daily. 05/15/13   Newt Minion, MD  ?CALCIUM PO Take 100 mg by mouth daily.    [provider]  ?Cholecalciferol (VITAMIN D PO) Take 2,000 Units by mouth daily.    [provider]  ?HYDROcodone-acetaminophen (NORCO) 5-325 MG per tablet Take 1 tablet by mouth every 6 (six) hours as needed for pain. 05/15/13    Newt Minion, MD  ?levothyroxine (SYNTHROID, LEVOTHROID) 137 MCG tablet Take 137 mcg by mouth daily before breakfast.    [provider]  ?losartan (COZAAR) 50 MG tablet Take 50 mg by mouth daily.    [provider]  ?metFORMIN (GLUCOPHAGE) 500 MG tablet Take 1,000 mg by mouth 2 (two) times daily. 02/19/17   [provider]  ?Multiple Vitamin (MULTIVITAMIN WITH MINERALS) TABS Take 1 tablet by mouth daily.    [provider]  ?multivitamin-lutein (OCUVITE-LUTEIN) CAPS Take 1 capsule by mouth daily.    [provider]  ?omeprazole (PRILOSEC) 40 MG capsule Take 40 mg by mouth daily.    [provider]  ?   ? ?Allergies    ?Codeine and Morphine and related   ? ?Review of Systems   ?Review of Systems  ?Constitutional:  Negative for fever.  ?Respiratory:  Negative for shortness of breath.   ?Cardiovascular:  Negative for chest pain.  ?Gastrointestinal:  Negative for abdominal pain.  ?Musculoskeletal:  Negative for back pain and neck pain.  ?Neurological:  Negative for weakness and numbness.  ? ?Physical Exam ?Updated Vital Signs ?BP (!) 150/72   Pulse 91   Temp 97.8 ?F (36.6 ?C) (Oral)   Resp 16   Ht 5\' 2"  (  1.575 m)   Wt 93 kg   SpO2 97%   BMI 37.49 kg/m?  ?Physical Exam ?Vitals and nursing note reviewed.  ?Constitutional:   ?   General: She is not in acute distress. ?   Appearance: Normal appearance. She is well-developed.  ?HENT:  ?   Head: Normocephalic and atraumatic.  ?Eyes:  ?   Conjunctiva/sclera: Conjunctivae normal.  ?Cardiovascular:  ?   Rate and Rhythm: Normal rate and regular rhythm.  ?   Heart sounds: No murmur heard. ?Pulmonary:  ?   Effort: Pulmonary effort is normal. No respiratory distress.  ?   Breath sounds: Normal breath sounds.  ?Abdominal:  ?   Palpations: Abdomen is soft.  ?   Tenderness: There is no abdominal tenderness.  ?Musculoskeletal:     ?   General: Tenderness, deformity and signs of injury present. No swelling.  ?   Cervical  back: Neck supple.  ?   Comments: She has some swelling and ecchymosis over the dorsum of her hand.  Her fourth and fifth digits are tender and swollen and have limited range of motion, angulated.  Wrist elbow shoulder nontender.  Distal sensation intact.  Radial pulse 2+.  ?Skin: ?   General: Skin is warm and dry.  ?   Capillary Refill: Capillary refill takes less than 2 seconds.  ?   Findings: Bruising present.  ?Neurological:  ?   Mental Status: She is alert.  ?Psychiatric:     ?   Mood and Affect: Mood normal.  ? ? ? ?ED Results / Procedures / Treatments   ?Labs ?(all labs ordered are listed, but only abnormal results are displayed) ?Labs Reviewed - No data to display ? ?EKG ?None ? ?Radiology ?DG Hand Complete Right ? ?Result Date: 02/23/2022 ?CLINICAL DATA:  Right hand pain after fall EXAM: RIGHT HAND - COMPLETE 3+ VIEW COMPARISON:  None Available. FINDINGS: Acute comminuted fractures of the bases of the ring and small finger proximal phalanxes. Both fracture sites are dorsally angulated. There is intra-articular extension into the fourth and fifth MCP joints. No dislocation. No additional fractures. Soft tissue swelling at the fracture sites. IMPRESSION: Acute, comminuted, and angulated intra-articular fractures of the bases of the ring and small finger proximal phalanxes. No dislocation. Electronically Signed   By: Davina Poke D.O.   On: 02/23/2022 17:44   ? ?Procedures ?Procedures  ? ? ?Medications Ordered in ED ?Medications - No data to display ? ?ED Course/ Medical Decision Making/ A&P ?Clinical Course as of 02/24/22 0842  ?Fri Feb 23, 2022  ?1756 Reviewed case with Dr. Tempie Donning hand surgery.  He recommends ulnar gutter and follow-up in the office in 1 week. [MB]  ?1832 Patient placed in ulnar gutter with some improvement in her alignment.  Normal sensation and cap refill after application of splint.  Return instructions discussed [MB]  ?  ?Clinical Course User Index ?[MB] Hayden Rasmussen, MD  ? ?                         ?Medical Decision Making ?Amount and/or Complexity of Data Reviewed ?Radiology: ordered. ? ?This patient complains of right hand injury; this involves an extensive number of treatment ?Options and is a complaint that carries with it a high risk of complications and ?morbidity. The differential includes fracture, dislocation, subluxation, contusion, sprain ? ?I ordered imaging studies which included right hand and I independently ?   visualized and interpreted imaging which showed  fractures of the base of the proximal fourth and fifth phalanx ?Previous records obtained and reviewed in epic no recent admissions ?I consulted Dr. Tempie Donning and discussed lab and imaging findings and discussed disposition.  ?Social determinants considered, no significant barriers ?Critical Interventions: None ? ?After the interventions stated above, I reevaluated the patient and found patient to be neurovascular intact after splinting ?Admission and further testing considered, no indications for admission or further testing at this time.  Patient will need to follow-up outpatient with hand surgery.  Return instructions discussed. ? ? ? ? ? ? ? ? ? ?Final Clinical Impression(s) / ED Diagnoses ?Final diagnoses:  ?Closed displaced fracture of proximal phalanx of right ring finger, initial encounter  ?Closed displaced fracture of proximal phalanx of finger of right hand  ? ? ?Rx / DC Orders ?ED Discharge Orders   ? ? None  ? ?  ? ? ?  ?Hayden Rasmussen, MD ?02/24/22 [phone] ? ?

## 2022-02-23 NOTE — ED Notes (Signed)
XR at bedside

## 2022-02-23 NOTE — ED Triage Notes (Signed)
States fell going up the basement steps. Pain and swelling to right hand. Was referred by UC.  ?

## 2022-02-23 NOTE — Telephone Encounter (Signed)
Pt's sx scheduled for 03/16/22 will have letter ready prior. ?

## 2022-02-26 ENCOUNTER — Encounter: Payer: Self-pay | Admitting: Orthopedic Surgery

## 2022-02-26 NOTE — Telephone Encounter (Signed)
Letter written. Will notify pt can pick up at front desk on left side of office. ?

## 2022-02-27 NOTE — Telephone Encounter (Signed)
Pt informed will p/u at check in desk on left side of office. ?

## 2022-03-01 ENCOUNTER — Telehealth: Payer: Self-pay | Admitting: Orthopedic Surgery

## 2022-03-01 ENCOUNTER — Ambulatory Visit (INDEPENDENT_AMBULATORY_CARE_PROVIDER_SITE_OTHER): Payer: Medicare HMO

## 2022-03-01 ENCOUNTER — Encounter: Payer: Self-pay | Admitting: Orthopedic Surgery

## 2022-03-01 ENCOUNTER — Encounter (HOSPITAL_BASED_OUTPATIENT_CLINIC_OR_DEPARTMENT_OTHER): Payer: Self-pay | Admitting: Orthopedic Surgery

## 2022-03-01 ENCOUNTER — Ambulatory Visit: Payer: Medicare HMO | Admitting: Orthopedic Surgery

## 2022-03-01 DIAGNOSIS — S62616A Displaced fracture of proximal phalanx of right little finger, initial encounter for closed fracture: Secondary | ICD-10-CM | POA: Diagnosis not present

## 2022-03-01 DIAGNOSIS — S62614A Displaced fracture of proximal phalanx of right ring finger, initial encounter for closed fracture: Secondary | ICD-10-CM | POA: Diagnosis not present

## 2022-03-01 DIAGNOSIS — M79641 Pain in right hand: Secondary | ICD-10-CM

## 2022-03-01 NOTE — Progress Notes (Signed)
? ?Office Visit Note ?  ?Patient: Alice Scott           ?Date of Birth: 01/24/1951           ?MRN: [id number] ?Visit Date: 03/01/2022 ?             ?Requested by: Anderson, Teresa, FNP ?[address] ?[address] ?[place],  [place] [address] ?PCP: Anderson, Teresa, FNP ? ? ?Assessment & Plan: ?Visit Diagnoses:  ?1. Pain in right hand   ?2. Closed displaced fracture of proximal phalanx of right little finger, initial encounter   ?3. Closed displaced fracture of proximal phalanx of right ring finger, initial encounter   ? ? ?Plan: we reviewed her x-rays from the ER and taken today.  They demonstrate unstable fractures of the right ring and small finger proximal phalangeal bases.  We discussed the nature of her injury as well as both conservative and surgical treatment options but after discussion, the patient like to proceed with closed duction percutaneous pinning of these fractures.  We reviewed the risks of surgery that include but not limited to bleeding, infection, damage to neurovascular structures, pin tract infection, nonunion, malunion, need for additional surgery.  She would like to proceed.  Surgical date and time will be confirmed with the patient. ? ?Follow-Up Instructions: No follow-ups on file.  ? ?Orders:  ?Orders Placed This Encounter  ?Procedures  ? XR Hand Complete Right  ? ?No orders of the defined types were placed in this encounter. ? ? ? ? Procedures: ?No procedures performed ? ? ?Clinical Data: ?No additional findings. ? ? ?Subjective: ?Chief Complaint  ?Patient presents with  ? Right Hand - New Patient (Initial Visit)  ? ? 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  She was walking up some stairs on Friday when she tripped and fell onto concrete onto her right hand.  She describes pain at the ring and small fingers that are worse with attempted range of  motion..  She has been in a splint which is quite comfortable for her.  She denies pain in the remainder of her fingers, hand, or wrist, ? ? ?Review of Systems ? ? ?Objective: ?Vital Signs: There were no vitals taken for this visit. ? ?Physical Exam ?Constitutional:   ?   Appearance: Normal appearance.  ?Cardiovascular:  ?   Rate and Rhythm: Normal rate.  ?   Pulses: Normal pulses.  ?Pulmonary:  ?   Effort: Pulmonary effort is normal.  ?Skin: ?   General: Skin is warm and dry.  ?   Capillary Refill: Capillary refill takes less than 2 seconds.  ?Neurological:  ?   Mental Status: She is alert.  ? ? ?Right Hand Exam  ? ?Tenderness  ?Right hand tenderness location: Severely TTP at ring and small finger proximal phalanges. ? ?Other  ?Erythema: absent ?Sensation: normal ?Pulse: present ? ?Comments:  Significant ecchymosis of entire hand.  No open injuries.  Apex volar deformity of small finger.  Tender to palpation along the entire ring and small fingers.  No pain with palpation or range of motion of the wrist.  No pain with palpation of the remainder of the hand.  Hand is warm and well-perfused. ? ? ? ? ?Specialty Comments:  ?No specialty comments available. ? ?Imaging: ?No results found. ? ? ?PMFS History: ?Patient Active Problem List  ? Diagnosis Date Noted  ?   Closed displaced fracture of proximal phalanx of right little finger 03/01/2022  ? Closed displaced fracture of proximal phalanx of right ring finger 03/01/2022  ? 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 ?Past Medical History:  ?Diagnosis Date  ? Anemia   ? Arthritis   ? GERD (gastroesophageal reflux disease)   ? Gout   ? Hypothyroidism   ?  ?History reviewed. No pertinent family history.  ?Past Surgical History:  ?Procedure Laterality Date  ? ABDOMINAL HYSTERECTOMY    ? APPENDECTOMY    ? arthroscopic knee Right   ? CHOLECYSTECTOMY    ? GASTRIC BYPASS  2004  ? TOTAL KNEE ARTHROPLASTY Right 05/13/2013  ? Procedure: TOTAL KNEE ARTHROPLASTY;  Surgeon: Marcus  V Duda, MD;  Location: [facility] OR;  Service: Orthopedics;  Laterality: Right;  Right Total Knee Arthroplasty  ? ?Social History  ? ?Occupational History  ? Not on file  ?Tobacco Use  ? Smoking status: Never  ? Smokeless tobacco: Never  ?Substance and Sexual Activity  ? Alcohol use: No  ? Drug use: No  ? Sexual activity: Not on file  ? ? ? ? ? ? ?

## 2022-03-01 NOTE — Telephone Encounter (Signed)
Please cxl knee surgery at the end of month --as pt is having hand surgery with Dr Frazier Butt on Monday ?

## 2022-03-01 NOTE — Telephone Encounter (Signed)
Pt called wondering if she can get a out of work note for after her surgery that she gonna be out for about a month or so?  ? ?Cb [phone]  ?

## 2022-03-01 NOTE — H&P (View-Only) (Signed)
? ?Office Visit Note ?  ?Patient: Alice Scott           ?Date of Birth: May 16, 1951           ?MRN: [id number] ?Visit Date: 03/01/2022 ?             ?Requested by: Vicenta Aly, [place] ?[address] ?[place],  [place] [address] ?PCP: Vicenta Aly, FNP ? ? ?Assessment & Plan: ?Visit Diagnoses:  ?1. Pain in right hand   ?2. Closed displaced fracture of proximal phalanx of right little finger, initial encounter   ?3. Closed displaced fracture of proximal phalanx of right ring finger, initial encounter   ? ? ?Plan: we reviewed her x-rays from the ER and taken today.  They demonstrate unstable fractures of the right ring and small finger proximal phalangeal bases.  We discussed the nature of her injury as well as both conservative and surgical treatment options but after discussion, the patient like to proceed with closed duction percutaneous pinning of these fractures.  We reviewed the risks of surgery that include but not limited to bleeding, infection, damage to neurovascular structures, pin tract infection, nonunion, malunion, need for additional surgery.  She would like to proceed.  Surgical date and time will be confirmed with the patient. ? ?Follow-Up Instructions: No follow-ups on file.  ? ?Orders:  ?Orders Placed This Encounter  ?Procedures  ? XR Hand Complete Right  ? ?No orders of the defined types were placed in this encounter. ? ? ? ? Procedures: ?No procedures performed ? ? ?Clinical Data: ?No additional findings. ? ? ?Subjective: ?Chief Complaint  ?Patient presents with  ? Right Hand - New Patient (Initial Visit)  ? ? 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  She was walking up some stairs on Friday when she tripped and fell onto concrete onto her right hand.  She describes pain at the ring and small fingers that are worse with attempted range of  motion..  She has been in a splint which is quite comfortable for her.  She denies pain in the remainder of her fingers, hand, or wrist, ? ? ?Review of Systems ? ? ?Objective: ?Vital Signs: There were no vitals taken for this visit. ? ?Physical Exam ?Constitutional:   ?   Appearance: Normal appearance.  ?Cardiovascular:  ?   Rate and Rhythm: Normal rate.  ?   Pulses: Normal pulses.  ?Pulmonary:  ?   Effort: Pulmonary effort is normal.  ?Skin: ?   General: Skin is warm and dry.  ?   Capillary Refill: Capillary refill takes less than 2 seconds.  ?Neurological:  ?   Mental Status: She is alert.  ? ? ?Right Hand Exam  ? ?Tenderness  ?Right hand tenderness location: Severely TTP at ring and small finger proximal phalanges. ? ?Other  ?Erythema: absent ?Sensation: normal ?Pulse: present ? ?Comments:  Significant ecchymosis of entire hand.  No open injuries.  Apex volar deformity of small finger.  Tender to palpation along the entire ring and small fingers.  No pain with palpation or range of motion of the wrist.  No pain with palpation of the remainder of the hand.  Hand is warm and well-perfused. ? ? ? ? ?Specialty Comments:  ?No specialty comments available. ? ?Imaging: ?No results found. ? ? ?PMFS History: ?Patient Active Problem List  ? Diagnosis Date Noted  ?  Closed displaced fracture of proximal phalanx of right little finger 03/01/2022  ? Closed displaced fracture of proximal phalanx of right ring finger 03/01/2022  ? 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 ?Past Medical History:  ?Diagnosis Date  ? Anemia   ? Arthritis   ? GERD (gastroesophageal reflux disease)   ? Gout   ? Hypothyroidism   ?  ?History reviewed. No pertinent family history.  ?Past Surgical History:  ?Procedure Laterality Date  ? ABDOMINAL HYSTERECTOMY    ? APPENDECTOMY    ? arthroscopic knee Right   ? CHOLECYSTECTOMY    ? GASTRIC BYPASS  2004  ? TOTAL KNEE ARTHROPLASTY Right 05/13/2013  ? Procedure: TOTAL KNEE ARTHROPLASTY;  Surgeon: Newt Minion, MD;  Location: [facility];  Service: Orthopedics;  Laterality: Right;  Right Total Knee Arthroplasty  ? ?Social History  ? ?Occupational History  ? Not on file  ?Tobacco Use  ? Smoking status: Never  ? Smokeless tobacco: Never  ?Substance and Sexual Activity  ? Alcohol use: No  ? Drug use: No  ? Sexual activity: Not on file  ? ? ? ? ? ? ?

## 2022-03-01 NOTE — Telephone Encounter (Signed)
Patient is scheduled for surgery on 03/07/2022. What is the estimated time frame of her being out of work.  ?

## 2022-03-05 ENCOUNTER — Encounter (HOSPITAL_BASED_OUTPATIENT_CLINIC_OR_DEPARTMENT_OTHER)
Admission: RE | Admit: 2022-03-05 | Discharge: 2022-03-05 | Disposition: A | Discharge status: Home / Self Care | Payer: Medicare HMO | Source: Ambulatory Visit | Attending: Orthopedic Surgery | Admitting: Orthopedic Surgery

## 2022-03-05 DIAGNOSIS — Z01812 Encounter for preprocedural laboratory examination: Secondary | ICD-10-CM | POA: Diagnosis not present

## 2022-03-05 LAB — BASIC METABOLIC PANEL
Anion gap: 9 (ref 5–15)
BUN: 20 mg/dL (ref 8–23)
CO2: 21 mmol/L — ABNORMAL LOW (ref 22–32)
Calcium: 9.2 mg/dL (ref 8.9–10.3)
Chloride: 111 mmol/L (ref 98–111)
Creatinine, Ser: 1.02 mg/dL — ABNORMAL HIGH (ref 0.44–1.00)
GFR, Estimated: 58 mL/min — ABNORMAL LOW (ref >60)
Glucose, Bld: 146 mg/dL — ABNORMAL HIGH (ref 70–99)
Potassium: 3.8 mmol/L (ref 3.5–5.1)
Sodium: 141 mmol/L (ref 135–145)

## 2022-03-05 NOTE — Progress Notes (Signed)

## 2022-03-05 NOTE — Telephone Encounter (Signed)
Patient aware note written for her and at the front desk  ?

## 2022-03-06 ENCOUNTER — Other Ambulatory Visit (HOSPITAL_COMMUNITY): Payer: Medicare HMO

## 2022-03-07 ENCOUNTER — Other Ambulatory Visit: Payer: Self-pay

## 2022-03-07 ENCOUNTER — Ambulatory Visit (HOSPITAL_BASED_OUTPATIENT_CLINIC_OR_DEPARTMENT_OTHER): Payer: Medicare HMO | Admitting: Anesthesiology

## 2022-03-07 ENCOUNTER — Encounter (HOSPITAL_BASED_OUTPATIENT_CLINIC_OR_DEPARTMENT_OTHER): Payer: Self-pay | Admitting: Orthopedic Surgery

## 2022-03-07 ENCOUNTER — Encounter (HOSPITAL_BASED_OUTPATIENT_CLINIC_OR_DEPARTMENT_OTHER)
Admission: RE | Disposition: A | Discharge status: Home / Self Care | Payer: Self-pay | Source: Ambulatory Visit | Attending: Orthopedic Surgery

## 2022-03-07 ENCOUNTER — Ambulatory Visit (HOSPITAL_BASED_OUTPATIENT_CLINIC_OR_DEPARTMENT_OTHER)
Admission: RE | Admit: 2022-03-07 | Discharge: 2022-03-07 | Disposition: A | Discharge status: Home / Self Care | Payer: Medicare HMO | Source: Ambulatory Visit | Attending: Orthopedic Surgery | Admitting: Orthopedic Surgery

## 2022-03-07 ENCOUNTER — Ambulatory Visit (HOSPITAL_BASED_OUTPATIENT_CLINIC_OR_DEPARTMENT_OTHER): Payer: Medicare HMO

## 2022-03-07 DIAGNOSIS — K219 Gastro-esophageal reflux disease without esophagitis: Secondary | ICD-10-CM | POA: Insufficient documentation

## 2022-03-07 DIAGNOSIS — Z7984 Long term (current) use of oral hypoglycemic drugs: Secondary | ICD-10-CM | POA: Diagnosis not present

## 2022-03-07 DIAGNOSIS — S62616A Displaced fracture of proximal phalanx of right little finger, initial encounter for closed fracture: Secondary | ICD-10-CM | POA: Insufficient documentation

## 2022-03-07 DIAGNOSIS — W1839XA Other fall on same level, initial encounter: Secondary | ICD-10-CM | POA: Diagnosis not present

## 2022-03-07 DIAGNOSIS — S62614A Displaced fracture of proximal phalanx of right ring finger, initial encounter for closed fracture: Secondary | ICD-10-CM | POA: Insufficient documentation

## 2022-03-07 DIAGNOSIS — E119 Type 2 diabetes mellitus without complications: Secondary | ICD-10-CM | POA: Diagnosis not present

## 2022-03-07 DIAGNOSIS — E039 Hypothyroidism, unspecified: Secondary | ICD-10-CM | POA: Insufficient documentation

## 2022-03-07 DIAGNOSIS — I10 Essential (primary) hypertension: Secondary | ICD-10-CM

## 2022-03-07 HISTORY — PX: CLOSED REDUCTION FINGER WITH PERCUTANEOUS PINNING: SHX5612

## 2022-03-07 HISTORY — DX: Type 2 diabetes mellitus without complications: E11.9

## 2022-03-07 LAB — GLUCOSE, CAPILLARY
Glucose-Capillary: 104 mg/dL — ABNORMAL HIGH (ref 70–99)
Glucose-Capillary: 96 mg/dL (ref 70–99)

## 2022-03-07 SURGERY — CLOSED REDUCTION, FINGER, WITH PERCUTANEOUS PINNING
Anesthesia: Monitor Anesthesia Care | Site: Finger | Laterality: Right

## 2022-03-07 MED ORDER — MEPERIDINE HCL 25 MG/ML IJ SOLN: 6.2500 mg | INTRAMUSCULAR | Status: DC | PRN

## 2022-03-07 MED ORDER — ROPIVACAINE HCL 5 MG/ML IJ SOLN
INTRAMUSCULAR | Status: DC | PRN
  Administered 2022-03-07 (×5): 5 mL via PERINEURAL

## 2022-03-07 MED ORDER — MIDAZOLAM HCL 2 MG/2ML IJ SOLN
INTRAMUSCULAR | Status: AC
  Filled 2022-03-07: qty 2

## 2022-03-07 MED ORDER — OXYCODONE HCL 5 MG/5ML PO SOLN: 5.0000 mg | Freq: Once | ORAL | Status: DC | PRN

## 2022-03-07 MED ORDER — MIDAZOLAM HCL 2 MG/2ML IJ SOLN
2.0000 mg | Freq: Once | INTRAMUSCULAR | Status: AC
  Administered 2022-03-07: 1 mg via INTRAVENOUS

## 2022-03-07 MED ORDER — ONDANSETRON HCL 4 MG/2ML IJ SOLN
INTRAMUSCULAR | Status: DC | PRN
  Administered 2022-03-07: 4 mg via INTRAVENOUS

## 2022-03-07 MED ORDER — FENTANYL CITRATE (PF) 100 MCG/2ML IJ SOLN
INTRAMUSCULAR | Status: AC
  Filled 2022-03-07: qty 2

## 2022-03-07 MED ORDER — OXYCODONE HCL 5 MG PO TABS: 5.0000 mg | ORAL_TABLET | Freq: Four times a day (QID) | ORAL | 0 refills | Status: AC | PRN

## 2022-03-07 MED ORDER — ONDANSETRON HCL 4 MG/2ML IJ SOLN: 4.0000 mg | Freq: Once | INTRAMUSCULAR | Status: DC | PRN

## 2022-03-07 MED ORDER — CEFAZOLIN SODIUM-DEXTROSE 2-4 GM/100ML-% IV SOLN
2.0000 g | INTRAVENOUS | Status: AC
  Administered 2022-03-07: 2 g via INTRAVENOUS

## 2022-03-07 MED ORDER — DEXAMETHASONE SODIUM PHOSPHATE 10 MG/ML IJ SOLN
INTRAMUSCULAR | Status: DC | PRN
  Administered 2022-03-07: 10 mg

## 2022-03-07 MED ORDER — PROPOFOL 500 MG/50ML IV EMUL
INTRAVENOUS | Status: DC | PRN
  Administered 2022-03-07: 200 ug/kg/min via INTRAVENOUS
  Administered 2022-03-07: 25 ug/kg/min via INTRAVENOUS

## 2022-03-07 MED ORDER — ACETAMINOPHEN 325 MG PO TABS: 325.0000 mg | ORAL_TABLET | ORAL | Status: DC | PRN

## 2022-03-07 MED ORDER — FENTANYL CITRATE (PF) 100 MCG/2ML IJ SOLN: 25.0000 ug | INTRAMUSCULAR | Status: DC | PRN

## 2022-03-07 MED ORDER — CEFAZOLIN SODIUM-DEXTROSE 2-4 GM/100ML-% IV SOLN
INTRAVENOUS | Status: AC
  Filled 2022-03-07: qty 100

## 2022-03-07 MED ORDER — LACTATED RINGERS IV SOLN: INTRAVENOUS | Status: DC

## 2022-03-07 MED ORDER — CLONIDINE HCL (ANALGESIA) 100 MCG/ML EP SOLN
EPIDURAL | Status: DC | PRN
  Administered 2022-03-07: 100 ug

## 2022-03-07 MED ORDER — ACETAMINOPHEN 160 MG/5ML PO SOLN: 325.0000 mg | ORAL | Status: DC | PRN

## 2022-03-07 MED ORDER — OXYCODONE HCL 5 MG PO TABS: 5.0000 mg | ORAL_TABLET | Freq: Once | ORAL | Status: DC | PRN

## 2022-03-07 MED ORDER — ACETAMINOPHEN 10 MG/ML IV SOLN: 1000.0000 mg | Freq: Once | INTRAVENOUS | Status: DC | PRN

## 2022-03-07 MED ORDER — FENTANYL CITRATE (PF) 100 MCG/2ML IJ SOLN
100.0000 ug | Freq: Once | INTRAMUSCULAR | Status: AC
  Administered 2022-03-07: 50 ug via INTRAVENOUS

## 2022-03-07 MED ORDER — LIDOCAINE 2% (20 MG/ML) 5 ML SYRINGE
INTRAMUSCULAR | Status: DC | PRN
  Administered 2022-03-07: 40 mg via INTRAVENOUS

## 2022-03-07 SURGICAL SUPPLY — 51 items
APL PRP STRL LF DISP 70% ISPRP (MISCELLANEOUS) ×1
BLADE SURG 15 STRL LF DISP TIS (BLADE) IMPLANT
BLADE SURG 15 STRL SS (BLADE)
BNDG CMPR 9X4 STRL LF SNTH (GAUZE/BANDAGES/DRESSINGS)
BNDG COHESIVE 1X5 TAN STRL LF (GAUZE/BANDAGES/DRESSINGS) IMPLANT
BNDG ELASTIC 3X5.8 VLCR STR LF (GAUZE/BANDAGES/DRESSINGS) ×2 IMPLANT
BNDG ESMARK 4X9 LF (GAUZE/BANDAGES/DRESSINGS) IMPLANT
BNDG GAUZE ELAST 4 BULKY (GAUZE/BANDAGES/DRESSINGS) ×2 IMPLANT
CAP PIN PROTECTOR ORTHO WHT (CAP) ×3 IMPLANT
CHLORAPREP W/TINT 26 (MISCELLANEOUS) ×2 IMPLANT
CORD BIPOLAR FORCEPS 12FT (ELECTRODE) IMPLANT
COVER BACK TABLE 60X90IN (DRAPES) ×2 IMPLANT
COVER MAYO STAND STRL (DRAPES) ×2 IMPLANT
CUFF TOURN SGL QUICK 18X4 (TOURNIQUET CUFF) IMPLANT
CUFF TOURN SGL QUICK 24 (TOURNIQUET CUFF)
CUFF TRNQT CYL 24X4X16.5-23 (TOURNIQUET CUFF) IMPLANT
DRAPE EXTREMITY T 121X128X90 (DISPOSABLE) ×2 IMPLANT
DRAPE OEC MINIVIEW 54X84 (DRAPES) ×2 IMPLANT
DRAPE SURG 17X23 STRL (DRAPES) ×2 IMPLANT
GAUZE 4X4 16PLY ~~LOC~~+RFID DBL (SPONGE) ×1 IMPLANT
GAUZE SPONGE 4X4 12PLY STRL (GAUZE/BANDAGES/DRESSINGS) ×1 IMPLANT
GAUZE XEROFORM 1X8 LF (GAUZE/BANDAGES/DRESSINGS) ×3 IMPLANT
GLOVE BIO SURGEON STRL SZ7 (GLOVE) ×2 IMPLANT
GOWN STRL REUS W/ TWL LRG LVL3 (GOWN DISPOSABLE) ×2 IMPLANT
GOWN STRL REUS W/ TWL XL LVL3 (GOWN DISPOSABLE) IMPLANT
GOWN STRL REUS W/TWL LRG LVL3 (GOWN DISPOSABLE) ×4
GOWN STRL REUS W/TWL XL LVL3 (GOWN DISPOSABLE)
K-WIRE DBL .045X4 NSTRL (WIRE) ×8
KWIRE DBL .045X4 NSTRL (WIRE) IMPLANT
NDL HYPO 25X1 1.5 SAFETY (NEEDLE) IMPLANT
NEEDLE HYPO 25X1 1.5 SAFETY (NEEDLE) IMPLANT
NS IRRIG 1000ML POUR BTL (IV SOLUTION) ×2 IMPLANT
PACK BASIN DAY SURGERY FS (CUSTOM PROCEDURE TRAY) ×2 IMPLANT
PAD CAST 3X4 CTTN HI CHSV (CAST SUPPLIES) IMPLANT
PADDING CAST ABS 3INX4YD NS (CAST SUPPLIES) ×2
PADDING CAST ABS COTTON 3X4 (CAST SUPPLIES) IMPLANT
PADDING CAST COTTON 3X4 STRL (CAST SUPPLIES)
SLEEVE SCD COMPRESS KNEE MED (STOCKING) IMPLANT
SLING ARM FOAM STRAP LRG (SOFTGOODS) ×1 IMPLANT
SPLINT FIBERGLASS 4X30 (CAST SUPPLIES) ×1 IMPLANT
SPLINT PLASTER CAST XFAST 4X15 (CAST SUPPLIES) ×10 IMPLANT
SPLINT PLASTER XTRA FAST SET 4 (CAST SUPPLIES) ×10
SUCTION FRAZIER HANDLE 10FR (MISCELLANEOUS)
SUCTION TUBE FRAZIER 10FR DISP (MISCELLANEOUS) ×1 IMPLANT
SUT ETHILON 4 0 PS 2 18 (SUTURE) ×2 IMPLANT
SUT MNCRL AB 3-0 PS2 18 (SUTURE) ×2 IMPLANT
SUT VICRYL 4-0 PS2 18IN ABS (SUTURE) IMPLANT
SYR BULB EAR ULCER 3OZ GRN STR (SYRINGE) IMPLANT
SYR CONTROL 10ML LL (SYRINGE) IMPLANT
TOWEL GREEN STERILE FF (TOWEL DISPOSABLE) ×4 IMPLANT
UNDERPAD 30X36 HEAVY ABSORB (UNDERPADS AND DIAPERS) ×2 IMPLANT

## 2022-03-07 NOTE — Anesthesia Procedure Notes (Addendum)
Anesthesia Regional Block: Supraclavicular block  ? ?Pre-Anesthetic Checklist: , timeout performed,  Correct Patient, Correct Site, Correct Laterality,  Correct Procedure, Correct Position, site marked,  Risks and benefits discussed,  Surgical consent,  Pre-op evaluation,  At surgeon's request and post-op pain management ? ?Laterality: Upper and Right ? ?Prep: chloraprep     ?  ?Needles:  ?Injection technique: Single-shot ? ?Needle Type: Echogenic Stimulator Needle   ? ? ?Needle Length: 9cm  ?Needle Gauge: 20  ? ?Needle insertion depth: 2.5 cm ? ? ?Additional Needles: ? ? ?Procedures:,,,, ultrasound used (permanent image in chart),,    ?Narrative:  ?Start time: 03/07/2022 1:20 PM ?End time: 03/07/2022 1:27 PM ?Injection made incrementally with aspirations every 5 mL. ? ?Performed by: Personally  ?Anesthesiologist: Leilani Able, MD ? ? ? ? ?

## 2022-03-07 NOTE — Anesthesia Procedure Notes (Signed)
Procedure Name: [place] ?Date/Time: 03/07/2022 1:40 PM ?Performed by: Ezequiel Kayser, CRNA ?Pre-anesthesia Checklist: Patient identified, Emergency Drugs available, Suction available, Patient being monitored and Timeout performed ?Patient Re-evaluated:Patient Re-evaluated prior to induction ?Oxygen Delivery Method: Simple face mask ? ? ? ? ?

## 2022-03-07 NOTE — Transfer of Care (Signed)
Immediate Anesthesia Transfer of Care Note ? ?Patient: Alice Scott ? ?Procedure(s) Performed: RIGHT RING AND SMALL FINGER CLOSED REDUCTION  WITH PERCUTANEOUS PINNING (Right: Finger) ? ?Patient Location: PACU ? ?Anesthesia Type:MAC combined with regional for post-op pain ? ?Level of Consciousness: awake, alert  and oriented ? ?Airway & Oxygen Therapy: Patient Spontanous Breathing and Patient connected to face mask oxygen ? ?Post-op Assessment: Report given to RN and Post -op Vital signs reviewed and stable ? ?Post vital signs: Reviewed and stable ? ?Last Vitals:  ?Vitals Value Taken Time  ?BP    ?Temp    ?Pulse 70 03/07/22 1450  ?Resp 19 03/07/22 1450  ?SpO2 93 % 03/07/22 1450  ?Vitals shown include unvalidated device data. ? ?Last Pain:  ?Vitals:  ? 03/07/22 1223  ?TempSrc: Oral  ?PainSc: 0-No pain  ?   ? ?Patients Stated Pain Goal: 4 (03/07/22 1223) ? ?Complications: No notable events documented. ?

## 2022-03-07 NOTE — Anesthesia Postprocedure Evaluation (Signed)
Anesthesia Post Note ? ?Patient: AXELLE SZWED ? ?Procedure(s) Performed: RIGHT RING AND SMALL FINGER CLOSED REDUCTION  WITH PERCUTANEOUS PINNING (Right: Finger) ? ?  ? ?Patient location during evaluation: Phase II ?Anesthesia Type: Regional and MAC ?Level of consciousness: awake ?Pain management: pain level controlled ?Vital Signs Assessment: post-procedure vital signs reviewed and stable ?Respiratory status: spontaneous breathing ?Cardiovascular status: stable ?Postop Assessment: no apparent nausea or vomiting ?Anesthetic complications: no ? ? ?No notable events documented. ? ?Last Vitals:  ?Vitals:  ? 03/07/22 1500 03/07/22 1515  ?BP: 121/68 (!) 134/57  ?Pulse: 63 (!) 58  ?Resp: 15 10  ?Temp:    ?SpO2: 96% 95%  ?  ?Last Pain:  ?Vitals:  ? 03/07/22 1515  ?TempSrc:   ?PainSc: 0-No pain  ? ? ?  ?  ?  ?  ?  ?  ? ?Huston Foley ? ? ? ? ?

## 2022-03-07 NOTE — Discharge Instructions (Addendum)
? ? ?Alice Scott, M.D. ?Hand Surgery ? ?POST-OPERATIVE DISCHARGE INSTRUCTIONS ? ? ?PRESCRIPTIONS: ?You may have been given a prescription to be taken as directed for post-operative pain control.  You may also take over the counter ibuprofen/aleve and tylenol for pain. Take this as directed on the packaging. Do not exceed 3000 mg tylenol/acetaminophen in 24 hours. ? ?Ibuprofen 600-800 mg (3-4) tablets by mouth every 6 hours as needed for pain.  ?OR ?Aleve 2 tablets by mouth every 12 hours (twice daily) as needed for pain.  ?AND/OR ?Tylenol 1000 mg (2 tablets) every 8 hours as needed for pain. ? ?Please use your pain medication carefully, as refills are limited and you may not be provided with one.  As stated above, please use over the counter pain medicine - it will also be helpful with decreasing your swelling.  ? ? ?ANESTHESIA: ?After your surgery, post-surgical discomfort or pain is likely. This discomfort can last several days to a few weeks. At certain times of the day your discomfort may be more intense.  ? ?Did you receive a nerve block?  ?A nerve block can provide pain relief for one hour to two days after your surgery. As long as the nerve block is working, you will experience little or no sensation in the area the surgeon operated on.  ?As the nerve block wears off, you will begin to experience pain or discomfort. It is very important that you begin taking your prescribed pain medication before the nerve block fully wears off. Treating your pain at the first sign of the block wearing off will ensure your pain is better controlled and more tolerable when full-sensation returns. Do not wait until the pain is intolerable, as the medicine will be less effective. It is better to treat pain in advance than to try and catch up.  ? ?General Anesthesia:  ?If you did not receive a nerve block during your surgery, you will need to start taking your pain medication shortly after your surgery and should continue  to do so as prescribed by your surgeon.   ? ? ?ICE AND ELEVATION: ?You may use ice for the first 48-72 hours, but it is not critical.   ?Elevation, as much as possible for the next 48 hours, is critical for decreasing swelling as well as for pain relief. Elevation means when you are seated or lying down, you hand should be at or above your heart. When walking, the hand needs to be at or above the level of your elbow.  ?If the bandage gets too tight, it may need to be loosened. Please contact our office and we will instruct you in how to do this.  ? ? ?SURGICAL BANDAGES:  ?Keep your dressing and/or splint clean and dry at all times.  Do not remove until you are seen again in the office.  If careful, you may place a plastic bag over your bandage and tape the end to shower, but be careful, do not get your bandages wet.  ?  ? ?HAND THERAPY:  ?You may not need any. If you do, we will begin this at your follow up visit in the clinic.  ? ? ?ACTIVITY AND WORK: ?Light use of the fingers is allowed to assist the other hand with daily hygiene and eating, but strong gripping or lifting is often uncomfortable and should be avoided.  ?You might miss a variable period of time from work and hopefully this issue has been discussed prior to surgery. You may  not do any heavy work with your affected hand for about 2 weeks.  ? ? ?[facility] ?[address] ?[place],  [place]  [address] ?[phone]  ? ? ?Post Anesthesia Home Care Instructions ? ?Activity: ?Get plenty of rest for the remainder of the day. A responsible individual must stay with you for 24 hours following the procedure.  ?For the next 24 hours, DO NOT: ?-Drive a car ?-Advertising copywriter ?-Drink alcoholic beverages ?-Take any medication unless instructed by your physician ?-Make any legal decisions or sign important papers. ? ?Meals: ?Start with liquid foods such as gelatin or soup. Progress to regular foods as tolerated. Avoid greasy, spicy, heavy  foods. If nausea and/or vomiting occur, drink only clear liquids until the nausea and/or vomiting subsides. Call your physician if vomiting continues. ? ?Special Instructions/Symptoms: ?Your throat may feel dry or sore from the anesthesia or the breathing tube placed in your throat during surgery. If this causes discomfort, gargle with warm salt water. The discomfort should disappear within 24 hours. ? ?If you had a scopolamine patch placed behind your ear for the management of post- operative nausea and/or vomiting: ? ?1. The medication in the patch is effective for 72 hours, after which it should be removed.  Wrap patch in a tissue and discard in the trash. Wash hands thoroughly with soap and water. ?2. You may remove the patch earlier than 72 hours if you experience unpleasant side effects which may include dry mouth, dizziness or visual disturbances. ?3. Avoid touching the patch. Wash your hands with soap and water after contact with the patch. ?   Regional Anesthesia Blocks ? ?1. Numbness or the inability to move the "blocked" extremity may last from 3-48 hours after placement. The length of time depends on the medication injected and your individual response to the medication. If the numbness is not going away after 48 hours, call your surgeon. ? ?2. The extremity that is blocked will need to be protected until the numbness is gone and the  Strength has returned. Because you cannot feel it, you will need to take extra care to avoid injury. Because it may be weak, you may have difficulty moving it or using it. You may not know what position it is in without looking at it while the block is in effect. ? ?3. For blocks in the legs and feet, returning to weight bearing and walking needs to be done carefully. You will need to wait until the numbness is entirely gone and the strength has returned. You should be able to move your leg and foot normally before you try and bear weight or walk. You will need someone to  be with you when you first try to ensure you do not fall and possibly risk injury. ? ?4. Bruising and tenderness at the needle site are common side effects and will resolve in a few days. ? ?5. Persistent numbness or new problems with movement should be communicated to the surgeon or the [facility] [phone] [facility] [phone]).  ?

## 2022-03-07 NOTE — Progress Notes (Signed)
Assisted Dr. Arby Barrette with right, supraclavicular, ultrasound guided block. Side rails up, monitors on throughout procedure. See vital signs in flow sheet. Tolerated Procedure well. ?

## 2022-03-07 NOTE — Interval H&P Note (Signed)
History and Physical Interval Note: ? ?03/07/2022 ?12:32 PM ? ?Alice Scott  has presented today for surgery, with the diagnosis of RIGHT RING AND SMALL FINGER PROXIMAL PHALANX FRACTURE.  The various methods of treatment have been discussed with the patient and family. After consideration of risks, benefits and other options for treatment, the patient has consented to  Procedure(s): ?RIGHT RING AND SMALL FINGER CLOSED REDUCTION  WITH PERCUTANEOUS PINNING (Right) as a surgical intervention.  The patient's history has been reviewed, patient examined, no change in status, stable for surgery.  I have reviewed the patient's chart and labs.  Questions were answered to the patient's satisfaction.   ? ? ?Samay Delcarlo Darden Flemister ? ? ?

## 2022-03-07 NOTE — Anesthesia Preprocedure Evaluation (Signed)
Anesthesia Evaluation  ?Patient identified by MRN, date of birth, ID band ?Patient awake ? ? ? ?Reviewed: ?Allergy & Precautions, NPO status , Patient's Chart, lab work & pertinent test results ? ?Airway ?Mallampati: II ? ? ? ? ? ? Dental ?no notable dental hx. ? ?  ?Pulmonary ?neg pulmonary ROS,  ?  ?Pulmonary exam normal ? ? ? ? ? ? ? Cardiovascular ?hypertension, Pt. on medications ?Normal cardiovascular exam ? ? ?  ?Neuro/Psych ?negative neurological ROS ? negative psych ROS  ? GI/Hepatic ?Neg liver ROS, GERD  Medicated and Controlled,  ?Endo/Other  ?diabetes, Type 2, Oral Hypoglycemic AgentsHypothyroidism Morbid obesity ? Renal/GU ?negative Renal ROS  ?negative genitourinary ?  ?Musculoskeletal ? ? Abdominal ?(+) + obese,   ?Peds ? Hematology ?  ?Anesthesia Other Findings ? ? Reproductive/Obstetrics ? ?  ? ? ? ? ? ? ? ? ? ? ? ? ? ?  ?  ? ? ? ? ? ? ? ? ?Anesthesia Physical ?Anesthesia Plan ? ?ASA: 3 ? ?Anesthesia Plan: MAC and Regional  ? ?Post-op Pain Management: Regional block*  ? ?Induction: Intravenous ? ?PONV Risk Score and Plan: 2 and Ondansetron and Midazolam ? ?Airway Management Planned: Natural Airway, Simple Face Mask and Nasal Cannula ? ?Additional Equipment: None ? ?Intra-op Plan:  ? ?Post-operative Plan:  ? ?Informed Consent: I have reviewed the patients History and Physical, chart, labs and discussed the procedure including the risks, benefits and alternatives for the proposed anesthesia with the patient or authorized representative who has indicated his/her understanding and acceptance.  ? ? ? ? ? ?Plan Discussed with: CRNA ? ?Anesthesia Plan Comments:   ? ? ? ? ? ? ?Anesthesia Quick Evaluation ? ?

## 2022-03-07 NOTE — Op Note (Signed)
? ?Date of Surgery: 03/07/2022 ? ?INDICATIONS: Patient is a 71 y.o.-year-old right-hand-dominant female with closed right ring and small finger proximal phalangeal base fractures after a ground-level fall on 02/23/22.  Risks, benefits, and alternatives to surgery were again discussed with the patient in the preoperative area. The patient wishes to proceed with surgery.  Informed consent was signed after our discussion.  ? ?PREOPERATIVE DIAGNOSIS:  ?1.  Right ring finger proximal phalangeal base fracture ?2.  Right small finger proximal phalangeal base fracture ? ?POSTOPERATIVE DIAGNOSIS: Same. ? ?PROCEDURE: ?1.  Closed reduction and percutaneous pinning of right ring finger proximal phalanx fracture ?Closed duction percutaneous pinning of right small finger proximal phalanx fracture ? ? ?SURGEON: Audria Nine, M.D. ? ?ASSIST:  ? ?ANESTHESIA: Regional, sedation ? ?IV FLUIDS AND URINE: See anesthesia. ? ?ESTIMATED BLOOD LOSS: Less than 5 mL. ? ?IMPLANTS: * No implants in log *  ? ?DRAINS: None ? ?COMPLICATIONS: None ? ?DESCRIPTION OF PROCEDURE: The patient was met in the preoperative holding area where the surgical site was marked and the consent form was verified.  The patient was then taken to the operating room and transferred to the operating table.  All bony prominences were well padded.  A tourniquet was applied to the right upper arm.  Monitored sedation was induced.  The operative extremity was prepped and draped in the usual and sterile fashion.  A formal time-out was performed to confirm that this was the correct patient, surgery, side, and site.  ? ?Following timeout, the mini fluoroscopy was brought in.  I began with the ring finger.  Reduction was achieved with longitudinal traction and flexion of the phalanx at the fracture site to correct the apex volar angulation.  A 0.045 K wire was advanced in an antegrade fashion at the radial base of the proximal phalanx.  The K wire was advanced to the  subchondral bone of the radial head.  A second K wire was then advanced in a similar fashion from the ulnar base of the proximal phalanx into the subchondral bone of the phalangeal head.  AP and lateral views demonstrated appropriate reduction of the fracture.  I then performed the same procedure at the small finger.  A 0.045 K wire was advanced in antegrade fashion starting at the radial base of the small finger proximal phalanx.  Reduction was again achieved with longitudinal traction and flexion through the fracture site to correct the apex volar deformity.  The final 0.045 K wire was advanced in antegrade fashion from the ulnar base of the small finger proximal phalanx.  Final AP, lateral, and oblique fluoroscopic imaging demonstrated appropriate reduction of the fracture.  There was no evidence of clinical malrotation with tenodesis of the fingers. ? ?The K wires were all cut, bent, and pin caps applied.  1 final fluoroscopic this demonstrated that the K wires did not back out during this process.  Wires were dressed with Xeroform, 4 x 4's, folded Kerlix, cast padding, and a well-padded ulnar gutter splint was applied. ? ?Patient was then reversed from anesthesia and transferred to the postoperative bed.  All counts were correct x2 at the end of procedure.  The patient was then taken to PACU in stable condition. ? ?POSTOPERATIVE PLAN: She will be discharged home with appropriate pain medication and discharge instructions.  See her back in the office in 10 to 14 days for her first postop visit.  A referral will be made to hand therapy to have a Thermoplast splint made and  to work on edema control and range of motion. ? ?Audria Nine, MD ?2:42 PM  ?

## 2022-03-08 ENCOUNTER — Encounter (HOSPITAL_BASED_OUTPATIENT_CLINIC_OR_DEPARTMENT_OTHER): Payer: Self-pay | Admitting: Orthopedic Surgery

## 2022-03-08 ENCOUNTER — Telehealth: Payer: Self-pay | Admitting: Orthopedic Surgery

## 2022-03-08 NOTE — Telephone Encounter (Signed)
Patient called in stating she is allergic to codeine and cannot take the hydrocodone. And tramadol and morphine makes her sick she would like a different prescription called in to her preferred  pharmacy.

## 2022-03-16 ENCOUNTER — Ambulatory Visit (INDEPENDENT_AMBULATORY_CARE_PROVIDER_SITE_OTHER): Payer: Medicare HMO | Admitting: Orthopedic Surgery

## 2022-03-16 ENCOUNTER — Ambulatory Visit: Payer: Self-pay

## 2022-03-16 ENCOUNTER — Encounter: Payer: Self-pay | Admitting: Orthopedic Surgery

## 2022-03-16 ENCOUNTER — Ambulatory Visit: Admit: 2022-03-16 | Payer: Medicare HMO | Admitting: Orthopedic Surgery

## 2022-03-16 DIAGNOSIS — S62616A Displaced fracture of proximal phalanx of right little finger, initial encounter for closed fracture: Secondary | ICD-10-CM

## 2022-03-16 DIAGNOSIS — Z9889 Other specified postprocedural states: Secondary | ICD-10-CM

## 2022-03-16 DIAGNOSIS — S62614A Displaced fracture of proximal phalanx of right ring finger, initial encounter for closed fracture: Secondary | ICD-10-CM

## 2022-03-16 SURGERY — ARTHROPLASTY, KNEE, TOTAL
Anesthesia: Choice | Site: Knee | Laterality: Left

## 2022-03-16 NOTE — Progress Notes (Signed)
   Post-Op Visit Note   Patient: Alice Scott           Date of Birth: [birth year]           MRN: [id number] Visit Date: 03/16/2022 PCP: Elizabeth Palau, FNP   Assessment & Plan:  Chief Complaint:  Chief Complaint  Patient presents with   Right Hand - Routine Post Op   Visit Diagnoses:  1. Post-operative state   2. Closed displaced fracture of proximal phalanx of right little finger, initial encounter   3. Closed displaced fracture of proximal phalanx of right ring finger, initial encounter     Plan: Patient is 9 days s/p CRPP of right ring and small finger P1 base fractures.  Her pin sites are clean and dry.  The pin sites were cleaned and dressed.  She was put into an ulnar gutter splint.  She has not seen therapy yet.  Repeat x-rays show unchanged pin position or fracture alignment.   Follow-Up Instructions: No follow-ups on file.   Orders:  Orders Placed This Encounter  Procedures   XR Hand Complete Right   Ambulatory referral to Occupational Therapy   No orders of the defined types were placed in this encounter.   Imaging: No results found.  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 No pertinent family history.  Past Surgical History:  Procedure Laterality Date   ABDOMINAL HYSTERECTOMY     APPENDECTOMY     arthroscopic knee Right    CHOLECYSTECTOMY     CLOSED REDUCTION FINGER WITH PERCUTANEOUS PINNING Right 03/07/2022   Procedure: RIGHT RING AND SMALL FINGER CLOSED REDUCTION  WITH PERCUTANEOUS PINNING;  Surgeon: Marlyne Beards, MD;  Location: [facility];  Service: Orthopedics;  Laterality: Right;   GASTRIC BYPASS  2004   TOTAL KNEE ARTHROPLASTY Right 05/13/2013   Procedure: TOTAL KNEE ARTHROPLASTY;  Surgeon: Nadara Mustard, MD;  Location: [facility] OR;  Service: Orthopedics;  Laterality: Right;  Right Total Knee Arthroplasty   Social History   Occupational History   Not on file  Tobacco Use   Smoking status: Never   Smokeless tobacco: Never  Substance and Sexual Activity   Alcohol use: No   Drug use: No   Sexual activity: Not on file

## 2022-03-21 ENCOUNTER — Other Ambulatory Visit: Payer: Self-pay

## 2022-03-21 ENCOUNTER — Encounter: Payer: Self-pay | Admitting: Rehabilitative and Restorative Service Providers"

## 2022-03-21 ENCOUNTER — Ambulatory Visit: Payer: Medicare HMO | Admitting: Rehabilitative and Restorative Service Providers"

## 2022-03-21 DIAGNOSIS — M79641 Pain in right hand: Secondary | ICD-10-CM | POA: Diagnosis not present

## 2022-03-21 DIAGNOSIS — M25641 Stiffness of right hand, not elsewhere classified: Secondary | ICD-10-CM | POA: Diagnosis not present

## 2022-03-21 DIAGNOSIS — M6281 Muscle weakness (generalized): Secondary | ICD-10-CM | POA: Diagnosis not present

## 2022-03-21 DIAGNOSIS — R278 Other lack of coordination: Secondary | ICD-10-CM | POA: Diagnosis not present

## 2022-03-21 DIAGNOSIS — R600 Localized edema: Secondary | ICD-10-CM

## 2022-03-21 NOTE — Therapy (Signed)
OUTPATIENT OCCUPATIONAL THERAPY ORTHO EVALUATION  Patient Name: Alice Scott MRN: [id number] DOB:04/28/1951, 71 y.o., female Today's Date: 03/21/2022  PCP: Vicenta Aly, FNP REFERRING PROVIDER: Dr. Sherilyn Cooter    OT End of Session - 03/21/22 1347     Visit Number 1    Number of Visits 16    Date for OT Re-Evaluation 05/18/22    Authorization Type HU[employer]    Progress Note Due on Visit 10    OT Start Time 1347    OT Stop Time 1503    OT Time Calculation (min) 76 min    Equipment Utilized During Treatment orthotic materials    Activity Tolerance Patient tolerated treatment well;Patient limited by fatigue;Patient limited by pain;No increased pain    Behavior During Therapy WFL for tasks assessed/performed             Past Medical History:  Diagnosis Date   Anemia    Arthritis    Diabetes mellitus without complication (HCC)    GERD (gastroesophageal reflux disease)    Gout    Hypothyroidism    Past Surgical History:  Procedure Laterality Date   ABDOMINAL HYSTERECTOMY     APPENDECTOMY     arthroscopic knee Right    CHOLECYSTECTOMY     CLOSED REDUCTION FINGER WITH PERCUTANEOUS PINNING Right 03/07/2022   Procedure: RIGHT RING AND SMALL FINGER CLOSED REDUCTION  WITH PERCUTANEOUS PINNING;  Surgeon: Sherilyn Cooter, MD;  Location: [place];  Service: Orthopedics;  Laterality: Right;   GASTRIC BYPASS  2004   TOTAL KNEE ARTHROPLASTY Right 05/13/2013   Procedure: TOTAL KNEE ARTHROPLASTY;  Surgeon: Newt Minion, MD;  Location: [place];  Service: Orthopedics;  Laterality: Right;  Right Total Knee Arthroplasty   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    ONSET DATE: 03/07/22 DOS   REFERRING DIAG:  I01.655V (ICD-10-CM) - Closed  displaced fracture of proximal phalanx of right little finger, initial encounter  S62.614A (ICD-10-CM) - Closed displaced fracture of proximal phalanx of right ring finger, initial encounter    THERAPY DIAG:  Stiffness of right hand, not elsewhere classified - Plan: Ot plan of care cert/re-cert  Muscle weakness (generalized) - Plan: Ot plan of care cert/re-cert  Pain in right hand - Plan: Ot plan of care cert/re-cert  Other lack of coordination - Plan: Ot plan of care cert/re-cert  Localized edema - Plan: Ot plan of care cert/re-cert  Rationale for Evaluation and Treatment Rehabilitation  SUBJECTIVE:   SUBJECTIVE STATEMENT: She states tripping over a step (this is the third time) and this time she fell to the concrete and pinned her hand on the floor. She was scheduled for left knee replacement last week which had to be cancelled. She works P/T at [employer]: Per MD: Right ring and small finger proximal phalangeal fx s/p closed reduction and pinning.  Thermoplast splint.  Edema control and ROM."  PRECAUTIONS:  2 weeks post-op now; light AROM and blocking as tol, full AROM at 4 weeks (pins out) and start light FNL activities, wait until 5 weeks out for PROM as tolerated  WEIGHT BEARING RESTRICTIONS Yes NWB right hand for now   PAIN:  Are you having pain? Yes Rating: 2-3/10 at rest now   FALLS:  Has patient fallen in last 6 months? Yes, 3-4 times, not picking up feet enough to clear a small step.   LIVING ENVIRONMENT: Lives with: lives alone (moving into her mother's home)   PLOF: Independent  PATIENT GOALS Get dom right hand feeling and working better.    OBJECTIVE:   HAND DOMINANCE: Right   ADLs: Overall ADLs: States decreased ability to grab, hold household objects, pain and inability to pen containers, perform all FMS tasks, etc.    FUNCTIONAL OUTCOME MEASURES: Eval: Quck DASH 34% impairment today   UPPER EXTREMITY ROM     Active  ROM Right eval  Wrist flexion 64  Wrist extension 41  (Blank rows = not tested)  Active ROM Right eval  Ring MCP (0-90) 0- 26  Ring PIP (0-100) 0- 5  Ring DIP (0-70) 0- 9  Little MCP (0-90) 0-20   Little PIP (0-100) 0- 1  Little DIP (0-70) 0- 6  (Blank rows = not tested)   UPPER EXTREMITY MMT:    Eval: NT due to fx   MMT Right eval  Elbow flexion   Elbow extension   Wrist flexion   Wrist extension   (Blank rows = not tested)  HAND FUNCTION: Eval: Grip strength Right: TBD lbs, Left: TBD lbs   COORDINATION: Eval: Box and Blocks Test: Right TBD Blocks today (53 is [facility]); 9 Hole Peg Test Right: TBD sec (26 sec is WFL)   SENSATION: Eval:  Light touch intact today, though possibly very mildly reduced due to swelling   EDEMA:   Eval:  Mildly swollen in hand and wrist today, 20.1cm circumferentially around MCP Js  COGNITION: Overall cognitive status: WFL for evaluation today   OBSERVATIONS:   Eval: pins sites look clean and healing, 1 pin is pressing somewhat deeply in dorsum of metacarpal area and may cause some irritation, but right now is fine.    TODAY'S TREATMENT:  03/21/22 Eval: Custom orthotic fabrication was indicated due to pt's healing finger fractures and need for safe, functional positioning. OT fabricated custom hand-based SAFE orthotic (includes digits 3-5 of right hand) for pt today to protect fxs and prevent contractures. It fit well with no areas of pressure, pt states a comfortable fit. Pt was educated on the wearing schedule, to call or come in ASAP if it is causing any irritation or is not achieving desired function. It will be checked/adjusted in upcoming sessions, as needed. Pt states understanding.   OT also edu on initial HEP for limited AROM in finger flexion and ext, MCP J blocking flexion AROM, abd/adduction AROM, opposition AROM- all to be done as tolerated 4-6 x day, 15x each.  She was also edu not to lift with or use right hand for 1-2 more weeks  and to wash hand/keep clean but don't soak. She states understanding all.    PATIENT EDUCATION: Education details: See tx section above for details  Person educated: Patient Education method: Verbal Instruction, Teach back, Handouts  Education comprehension: States and demonstrates understanding, Additional Education required    HOME EXERCISE PROGRAM: See tx section above for details   GOALS: Goals reviewed with patient? Yes   SHORT TERM GOALS: (STG required if POC>30 days)  Pt will obtain protective, custom orthotic. Target date: 03/21/22 Goal status: MET  2.  Pt will demo/state understanding of initial HEP to improve pain levels and prerequisite motion. Target date: 04/06/22 Goal status: INITIAL   LONG TERM GOALS:  Pt will improve functional ability by decreased impairment  per Quick DASH assessment from 34% to 10% or better, for better quality of life. Target date: 05/18/22 Goal status: INITIAL  2.  Pt will improve grip strength in dom right hand to at least 40 lbs for functional use at home and in IADLs. Target date: 05/18/22 Goal status: INITIAL  3.  Pt will improve A/ROM in right ring and small finger TAM to at least 190*, to have functional motion for tasks like reach and grasp.  Target date: 05/18/22 Goal status: INITIAL     ASSESSMENT:  CLINICAL IMPRESSION: Patient is a 71 y.o. female who was seen today for occupational therapy evaluation for right hand fractures and decreased functional ability.   PERFORMANCE DEFICITS in functional skills including ADLs, IADLs, coordination, dexterity, edema, tone, ROM, strength, pain, fascial restrictions, muscle spasms, flexibility, FMC, GMC, body mechanics, endurance, decreased knowledge of precautions, and UE functional use, cognitive skills including problem solving and safety awareness, and psychosocial skills including coping strategies, environmental adaptation, habits, and routines and behaviors.   IMPAIRMENTS are  limiting patient from ADLs, IADLs, work, play, and leisure.   COMORBIDITIES has co-morbidities such as anemia, OA, GERD, DM II, gout  that affects occupational performance. Patient will benefit from skilled OT to address above impairments and improve overall function.  MODIFICATION OR ASSISTANCE TO COMPLETE EVALUATION: Min-Moderate modification of tasks or assist with assess necessary to complete an evaluation.  OT OCCUPATIONAL PROFILE AND HISTORY: Problem focused assessment: Including review of records relating to presenting problem.  CLINICAL DECISION MAKING: Moderate - several treatment options, min-mod task modification necessary  REHAB POTENTIAL: Good  EVALUATION COMPLEXITY: Low      PLAN: OT FREQUENCY: 2x/week (less often at times to allow healing)   OT DURATION: 8 weeks (through 05/18/22)  PLANNED INTERVENTIONS: self care/ADL training, therapeutic exercise, therapeutic activity, neuromuscular re-education, manual therapy, scar mobilization, passive range of motion, splinting, electrical stimulation, ultrasound, fluidotherapy, compression bandaging, moist heat, cryotherapy, contrast bath, patient/family education, coping strategies training, and DME and/or AE instructions  RECOMMENDED OTHER SERVICES: none now   CONSULTED AND AGREED WITH PLAN OF CARE: Patient  PLAN FOR NEXT SESSION: Check orthosis and initial HEP, upgrade AROM as tol    Armend Hochstatter, OTR/L, CHT 03/21/2022, 5:39 PM    Referring diagnosis?  V40.981X (ICD-10-CM) - Closed displaced fracture of proximal phalanx of right little finger, initial encounter  S62.614A (ICD-10-CM) - Closed displaced fracture of proximal phalanx of right ring finger, initial encounter   Treatment diagnosis? (if different than referring diagnosis) M62.81, M79.641, M25.641, R27.8, R60.0  What was this (referring dx) caused by? [x]  Surgery [x]  Fall []  Ongoing issue []  Arthritis []  Other: ____________  Laterality: [x]  Rt []   Lt []  Both  Check all possible CPT codes:  *CHOOSE 10 OR LESS*    [x]  97110 (Therapeutic Exercise)  []  92507 (SLP Treatment)  [x]  97112 (Neuro Re-ed)   []  92526 (Swallowing Treatment)   []  97116 (Gait Training)   []  [id number] (Cognitive Training, 1st 15 minutes) [x]  97140 (Manual Therapy)   []  97130 (Cognitive Training, each add'l 15 minutes)  []  97164 (Re-evaluation)                              []  Other, List CPT Code ____________  [x]  91478 (Therapeutic Activities)     [x]  97535 (Self Care)   []  All codes above (97110 - 97535)  []  97012 (Mechanical Traction)  [x]  97014 (E-stim Unattended)  []  29562 (  E-stim manual)  []  97033 (Ionto)  [x]  97035 (Ultrasound)  [x]  97760 (Orthotic Fit) []  [id number] (Physical Performance Training) []  [id number] (Aquatic Therapy) []  [phone] (Contrast Bath) []  [id number] (Paraffin) []  97597 (Wound Care 1st 20 sq cm) []  97598 (Wound Care each add'l 20 sq cm) []  97016 (Vasopneumatic Device) [x]  [phone] Comptroller) []  [phone] (Prosthetic Training)

## 2022-03-26 ENCOUNTER — Encounter: Payer: Self-pay | Admitting: Rehabilitative and Restorative Service Providers"

## 2022-03-26 ENCOUNTER — Ambulatory Visit (INDEPENDENT_AMBULATORY_CARE_PROVIDER_SITE_OTHER): Payer: Medicare HMO | Admitting: Rehabilitative and Restorative Service Providers"

## 2022-03-26 DIAGNOSIS — M25641 Stiffness of right hand, not elsewhere classified: Secondary | ICD-10-CM

## 2022-03-26 DIAGNOSIS — M6281 Muscle weakness (generalized): Secondary | ICD-10-CM | POA: Diagnosis not present

## 2022-03-26 DIAGNOSIS — R600 Localized edema: Secondary | ICD-10-CM

## 2022-03-26 DIAGNOSIS — M79641 Pain in right hand: Secondary | ICD-10-CM | POA: Diagnosis not present

## 2022-03-26 DIAGNOSIS — R278 Other lack of coordination: Secondary | ICD-10-CM

## 2022-03-26 NOTE — Therapy (Signed)
OUTPATIENT OCCUPATIONAL THERAPY TREATMENT NOTE   Patient Name: Alice Scott MRN: [id number] DOB:1951/03/20, 71 y.o., female Today's Date: 03/26/2022  PCP: Vicenta Aly, FNP REFERRING PROVIDER: Dr. Sherilyn Cooter   END OF SESSION:   OT End of Session - 03/26/22 1517     Visit Number 2    Number of Visits 16    Date for OT Re-Evaluation 05/18/22    Authorization Type [employer]    Progress Note Due on Visit 10    OT Start Time 1517    OT Stop Time 1607    OT Time Calculation (min) 50 min    Activity Tolerance Patient tolerated treatment well;Patient limited by fatigue;Patient limited by pain;No increased pain    Behavior During Therapy WFL for tasks assessed/performed             Past Medical History:  Diagnosis Date   Anemia    Arthritis    Diabetes mellitus without complication (HCC)    GERD (gastroesophageal reflux disease)    Gout    Hypothyroidism    Past Surgical History:  Procedure Laterality Date   ABDOMINAL HYSTERECTOMY     APPENDECTOMY     arthroscopic knee Right    CHOLECYSTECTOMY     CLOSED REDUCTION FINGER WITH PERCUTANEOUS PINNING Right 03/07/2022   Procedure: RIGHT RING AND SMALL FINGER CLOSED REDUCTION  WITH PERCUTANEOUS PINNING;  Surgeon: Sherilyn Cooter, MD;  Location: [place];  Service: Orthopedics;  Laterality: Right;   GASTRIC BYPASS  2004   TOTAL KNEE ARTHROPLASTY Right 05/13/2013   Procedure: TOTAL KNEE ARTHROPLASTY;  Surgeon: Newt Minion, MD;  Location: [place];  Service: Orthopedics;  Laterality: Right;  Right Total Knee Arthroplasty   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    ONSET DATE: 03/07/22 DOS    REFERRING DIAG:  L84.536I (ICD-10-CM) - Closed displaced fracture of proximal phalanx of  right little finger, initial encounter  S62.614A (ICD-10-CM) - Closed displaced fracture of proximal phalanx of right ring finger, initial encounter    THERAPY DIAG:  Muscle weakness (generalized)  Pain in right hand  Stiffness of right hand, not elsewhere classified  Localized edema  Other lack of coordination  Rationale for Evaluation and Treatment Rehabilitation  PERTINENT HISTORY: Per MD: Right ring and small finger proximal phalangeal fx s/p closed reduction and pinning.  Thermoplast splint.  Edema control and ROM."   PRECAUTIONS:  ~3 weeks post-op now; light AROM and blocking as tol, full AROM at 4 weeks (pins out) and start light FNL activities, wait until 5 weeks out for PROM as tolerated   WEIGHT BEARING RESTRICTIONS Yes NWB right hand for now   SUBJECTIVE:  She states orthotic fitting well, only mildly painful, using Voltaren topical which seems to help. She states noticing middle finger and wrist are more stiff now as well.   PAIN:  Are you having pain? Yes around pin sites  Rating: 5/10 at rest now    OBJECTIVE: (All objective assessments below are from initial evaluation on: 03/21/22 unless otherwise specified.)   HAND DOMINANCE: Right    ADLs: Overall ADLs: States decreased ability to grab, hold household objects, pain and inability to pen containers, perform all FMS tasks, etc.      FUNCTIONAL OUTCOME MEASURES: Eval: Quck DASH  34% impairment today    UPPER EXTREMITY ROM      Active ROM Right eval Right 03/26/22  Wrist flexion 64 50  Wrist extension 41 50  (Blank rows = not tested)   Active ROM Right eval Right TBD  Ring MCP (0-90) 0- 26   Ring PIP (0-100) 0- 5   Ring DIP (0-70) 0- 9   Little MCP (0-90) 0-20    Little PIP (0-100) 0- 1   Little DIP (0-70) 0- 6   (Blank rows = not tested)     UPPER EXTREMITY MMT:    Eval: NT due to fx    MMT Right eval  Elbow flexion    Elbow extension    Wrist flexion    Wrist extension    (Blank rows  = not tested)   HAND FUNCTION: Eval: Grip strength Right: TBD lbs, Left: TBD lbs    COORDINATION: Eval: Box and Blocks Test: Right TBD Blocks today (53 is [facility]); 9 Hole Peg Test Right: TBD sec (26 sec is WFL)    SENSATION: Eval:  Light touch intact today, though possibly very mildly reduced due to swelling    EDEMA:             Eval:  Mildly swollen in hand and wrist today, 20.1cm circumferentially around MCP Js   COGNITION: Overall cognitive status: WFL for evaluation today    OBSERVATIONS:             03/26/22: She has more guarding from pain today, pushes back against OT when OT trying to stretch her, not relaxing hand/wrist/fingers. She is cued/educated against this.   Eval: pins sites look clean and healing, 1 pin is pressing somewhat deeply in dorsum of metacarpal area and may cause some irritation, but right now is fine.      TODAY'S TREATMENT:  03/26/22: Due to high guarding from pain and likely some pain and swelling as well, her arm is becoming more stiff (seen through wrist, fingers tighter today). She is hesitant to come in more often, as she states living far away. OT edu / stresses importance of not guarding, relaxing, doing HEP frequently- 4-6x day or every other hour as tolerated (she had not been doing this often).  OT goes over complete HEP as listed below for stretching non-involved fingers, A/AROM and blocking AROM for involved joints & she demo's back. OT also adjusts orthotic to no longer include MF (this should be tolerated at this point). She states understanding importance of home compliance.   Exercises - Standing Shoulder Flexion Full Range  - 4-6 x daily - 1-2 sets - 10-15 reps - Bend and Pull Back Wrist SLOWLY  - 4-6 x daily - 1-2 sets - 10-15 reps - Seated Wrist Flexion with Overpressure  - 4-6 x daily - 3-5 reps - 15 sec hold - Wrist Extension Stretch Pronated  - 4-6 x daily - 3-5 reps - 15 hold - "Windshield Wipers"   - 4-6 x daily - 10-15 reps - Tendon  Glides  - 4-6 x daily - 10-15 reps - Hand AROM PIP Blocking  - 4-6 x daily - 1 sets - 10-15 reps - Seated Finger DIP Flexion AROM with Blocking  - 4-6 x daily - 1 sets - 10-15 reps - Touch Thumb to [facility] Finger  - 4-6 x daily - 10 reps - Seated Thumb MP Flexion PROM  - 4-6 x daily - 3-5 reps - 15 sec hold - Hand PROM MCP  Flexion  - 4-6 x daily - 3-5 reps - 15 hold - Seated Single Digit Intrinsic Stretch  - 4-6 x daily - 3-5 reps - 15-20 sec hold - Seated Finger Composite Flexion Stretch  - 4-6 x daily - 3-5 reps - 15 hold - Hand AROM Reverse Blocking  - 4-6 x daily - 1 sets - 10-15 reps  03/21/22 Eval: Custom orthotic fabrication was indicated due to pt's healing finger fractures and need for safe, functional positioning. OT fabricated custom hand-based SAFE orthotic (includes digits 3-5 of right hand) for pt today to protect fxs and prevent contractures. It fit well with no areas of pressure, pt states a comfortable fit. Pt was educated on the wearing schedule, to call or come in ASAP if it is causing any irritation or is not achieving desired function. It will be checked/adjusted in upcoming sessions, as needed. Pt states understanding.    OT also edu on initial HEP for limited AROM in finger flexion and ext, MCP J blocking flexion AROM, abd/adduction AROM, opposition AROM- all to be done as tolerated 4-6 x day, 15x each.  She was also edu not to lift with or use right hand for 1-2 more weeks and to wash hand/keep clean but don't soak. She states understanding all.      PATIENT EDUCATION: Education details: See tx section above for details  Person educated: Patient Education method: Verbal Instruction, Teach back, Handouts  Education comprehension: States and demonstrates understanding, Additional Education required      HOME EXERCISE PROGRAM: Access Code: [email or web address] URL: [email or web address] Date: 03/26/2022 Prepared by: Benito Mccreedy    GOALS: Goals reviewed with  patient? Yes     SHORT TERM GOALS: (STG required if POC>30 days)   Pt will obtain protective, custom orthotic. Target date: 03/21/22 Goal status: MET   2.  Pt will demo/state understanding of initial HEP to improve pain levels and prerequisite motion. Target date: 04/06/22 Goal status: INITIAL     LONG TERM GOALS:   Pt will improve functional ability by decreased impairment per Quick DASH assessment from 34% to 10% or better, for better quality of life. Target date: 05/18/22 Goal status: INITIAL   2.  Pt will improve grip strength in dom right hand to at least 40 lbs for functional use at home and in IADLs. Target date: 05/18/22 Goal status: INITIAL   3.  Pt will improve A/ROM in right ring and small finger TAM to at least 190*, to have functional motion for tasks like reach and grasp.  Target date: 05/18/22 Goal status: INITIAL         ASSESSMENT:   CLINICAL IMPRESSION: 03/26/22: no added pains during today's session other than typical discomfort when stretching, etc.  She needs to get busy at home or she will become more stiff.   03/21/22: Patient is a 71 y.o. female who was seen today for occupational therapy evaluation for right hand fractures and decreased functional ability.       PLAN: OT FREQUENCY: 2x/week (less often at times to allow healing)    OT DURATION: 8 weeks (through 05/18/22)   PLANNED INTERVENTIONS: self care/ADL training, therapeutic exercise, therapeutic activity, neuromuscular re-education, manual therapy, scar mobilization, passive range of motion, splinting, electrical stimulation, ultrasound, fluidotherapy, compression bandaging, moist heat, cryotherapy, contrast bath, patient/family education, coping strategies training, and DME and/or AE instructions   RECOMMENDED OTHER SERVICES: none now    CONSULTED AND AGREED WITH PLAN OF CARE: Patient   PLAN FOR NEXT SESSION:  Check orthosis (consider trimming to finer based only) and HEP, upgrade AROM to PROM  as tol once k-wires out.    Benito Mccreedy, OTR/L, CHT 03/26/2022, 5:04 PM

## 2022-03-30 ENCOUNTER — Ambulatory Visit (INDEPENDENT_AMBULATORY_CARE_PROVIDER_SITE_OTHER): Payer: Medicare HMO | Admitting: Orthopedic Surgery

## 2022-03-30 ENCOUNTER — Ambulatory Visit: Payer: Medicare HMO | Admitting: Rehabilitative and Restorative Service Providers"

## 2022-03-30 ENCOUNTER — Ambulatory Visit (INDEPENDENT_AMBULATORY_CARE_PROVIDER_SITE_OTHER): Payer: Medicare HMO

## 2022-03-30 ENCOUNTER — Encounter: Payer: Self-pay | Admitting: Rehabilitative and Restorative Service Providers"

## 2022-03-30 DIAGNOSIS — R600 Localized edema: Secondary | ICD-10-CM | POA: Diagnosis not present

## 2022-03-30 DIAGNOSIS — S62616A Displaced fracture of proximal phalanx of right little finger, initial encounter for closed fracture: Secondary | ICD-10-CM

## 2022-03-30 DIAGNOSIS — M79641 Pain in right hand: Secondary | ICD-10-CM

## 2022-03-30 DIAGNOSIS — R278 Other lack of coordination: Secondary | ICD-10-CM | POA: Diagnosis not present

## 2022-03-30 DIAGNOSIS — M25641 Stiffness of right hand, not elsewhere classified: Secondary | ICD-10-CM | POA: Diagnosis not present

## 2022-03-30 DIAGNOSIS — S62614A Displaced fracture of proximal phalanx of right ring finger, initial encounter for closed fracture: Secondary | ICD-10-CM

## 2022-03-30 DIAGNOSIS — M6281 Muscle weakness (generalized): Secondary | ICD-10-CM

## 2022-03-30 NOTE — Therapy (Signed)
OUTPATIENT OCCUPATIONAL THERAPY TREATMENT NOTE   Patient Name: Alice Scott MRN: [id number] DOB:[birth year], 71 y.o., female Today's Date: 03/30/2022  PCP: Vicenta Aly, FNP REFERRING PROVIDER: Dr. Sherilyn Cooter   END OF SESSION:   OT End of Session - 03/30/22 0840     Visit Number 3    Number of Visits 12    Date for OT Re-Evaluation 05/18/22    Authorization Type HUMA[employer]    Authorization - Visit Number 12    Progress Note Due on Visit 10    OT Start Time 0841    OT Stop Time 0923    OT Time Calculation (min) 42 min    Activity Tolerance Patient tolerated treatment well;Patient limited by fatigue;Patient limited by pain;No increased pain    Behavior During Therapy WFL for tasks assessed/performed              Past Medical History:  Diagnosis Date   Anemia    Arthritis    Diabetes mellitus without complication (HCC)    GERD (gastroesophageal reflux disease)    Gout    Hypothyroidism    Past Surgical History:  Procedure Laterality Date   ABDOMINAL HYSTERECTOMY     APPENDECTOMY     arthroscopic knee Right    CHOLECYSTECTOMY     CLOSED REDUCTION FINGER WITH PERCUTANEOUS PINNING Right 03/07/2022   Procedure: RIGHT RING AND SMALL FINGER CLOSED REDUCTION  WITH PERCUTANEOUS PINNING;  Surgeon: Sherilyn Cooter, MD;  Location: [place];  Service: Orthopedics;  Laterality: Right;   GASTRIC BYPASS  2004   TOTAL KNEE ARTHROPLASTY Right 05/13/2013   Procedure: TOTAL KNEE ARTHROPLASTY;  Surgeon: Newt Minion, MD;  Location: [place];  Service: Orthopedics;  Laterality: Right;  Right Total Knee Arthroplasty   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    ONSET DATE: 03/07/22 DOS    REFERRING DIAG:  G40.102V (ICD-10-CM) - Closed  displaced fracture of proximal phalanx of right little finger, initial encounter  S62.614A (ICD-10-CM) - Closed displaced fracture of proximal phalanx of right ring finger, initial encounter    THERAPY DIAG:  Stiffness of right hand, not elsewhere classified  Pain in right hand  Other lack of coordination  Localized edema  Muscle weakness (generalized)  Rationale for Evaluation and Treatment Rehabilitation  PERTINENT HISTORY: Per MD: Right ring and small finger proximal phalangeal fx s/p closed reduction and pinning.  Thermoplast splint.  Edema control and ROM."   PRECAUTIONS:  3+ weeks post-op now; light AROM and blocking as tol, full AROM at 4 weeks (pins out) and start light FNL activities, wait until 5 weeks out for PROM as tolerated   WEIGHT BEARING RESTRICTIONS Yes NWB right hand for now   SUBJECTIVE:  She states nothing new since Monday, has been doing HEP without sigificant problems. She is starting back to work tomorrow.   PAIN:  Are you having pain? Yes around pin sites  Rating: 3/10 at rest now (burning, throbbing)   OBJECTIVE: (All objective assessments below are from initial evaluation on: 03/21/22 unless otherwise specified.)   HAND DOMINANCE: Right    ADLs: Overall ADLs: States decreased ability to grab, hold household objects, pain and inability to pen containers, perform all FMS tasks, etc.      FUNCTIONAL OUTCOME MEASURES: Eval:  Quck DASH 34% impairment today    UPPER EXTREMITY ROM      Active ROM Right eval Right 03/26/22 Right 03/30/22  Wrist flexion 64 50 35  Wrist extension 41 50 45  (Blank rows = not tested)   Active ROM Right eval Right 03/30/22  Ring MCP (0-90) 0- 26 (-19*)- 51  Ring PIP (0-100) 0- 5 0- 19  Ring DIP (0-70) 0- 9 0- 9  Little MCP (0-90) 0-20  (-16*) - 43  Little PIP (0-100) 0- 1 0-0  Little DIP (0-70) 0- 6 0- 9  (Blank rows = not tested)     UPPER EXTREMITY MMT:    Eval: NT due to fx    MMT Right eval  Elbow flexion     Elbow extension    Wrist flexion    Wrist extension    (Blank rows = not tested)   HAND FUNCTION: Eval: Grip strength Right: TBD lbs, Left: TBD lbs    COORDINATION: Eval: Box and Blocks Test: Right TBD Blocks today (53 is [facility]); 9 Hole Peg Test Right: TBD sec (26 sec is WFL)    SENSATION: Eval:  Light touch intact today, though possibly very mildly reduced due to swelling    EDEMA:             Eval:  Mildly swollen in hand and wrist today, 20.1cm circumferentially around MCP Js   COGNITION: Overall cognitive status: WFL for evaluation today    OBSERVATIONS:           03/30/22: Less guarded now but stiffness in wrist has increased. IP Js bending better, but MCP Js not fully extending now.      03/26/22: She has more guarding from pain today, pushes back against OT when OT trying to stretch her, not relaxing hand/wrist/fingers. She is cued/educated against this.   Eval: pins sites look clean and healing, 1 pin is pressing somewhat deeply in dorsum of metacarpal area and may cause some irritation, but right now is fine.      TODAY'S TREATMENT:  03/30/22: OT reviews HEP with her as below. She was told to be very cautious at MCP J, AROM ok but no stretches there yet. Some other suggestions/corrections were made to wrist stretches and her extension AROM at MCP J. OT reminds her not to lift anything heavy yet, as she describes taking off brace to put on seatbelt and do other light tasks. OT also performs light manual stretches to rt hand IP Js as she tolerates today with some pain reported and so she uses ice 4 mins at end which helps with soreness (that time not included/billed for).   Exercises - Standing Shoulder Flexion Full Range  - 4-6 x daily - 1-2 sets - 10-15 reps - Bend and Pull Back Wrist SLOWLY  - 4-6 x daily - 1-2 sets - 10-15 reps - Seated Wrist Flexion with Overpressure  - 4-6 x daily - 3-5 reps - 15 sec hold - Wrist Extension Stretch Pronated  - 4-6 x daily - 3-5 reps - 15  hold - "Windshield Wipers"   - 4-6 x daily - 10-15 reps - Tendon Glides  - 4-6 x daily - 10-15 reps - Hand AROM PIP Blocking  - 4-6 x daily - 1 sets - 10-15 reps - Seated Finger DIP Flexion AROM with Blocking  - 4-6 x daily - 1 sets - 10-15 reps - Touch Thumb to [facility] Finger  - 4-6 x daily - 10 reps -  Seated Thumb MP Flexion PROM  - 4-6 x daily - 3-5 reps - 15 sec hold - Seated Single Digit Intrinsic Stretch  - 4-6 x daily - 3-5 reps - 15-20 sec hold - Seated Finger Composite Flexion Stretch  - 4-6 x daily - 3-5 reps - 15 hold - Hand AROM Reverse Blocking  - 4-6 x daily - 1 sets - 10-15 reps   03/26/22: Due to high guarding from pain and likely some pain and swelling as well, her arm is becoming more stiff (seen through wrist, fingers tighter today). She is hesitant to come in more often, as she states living far away. OT edu / stresses importance of not guarding, relaxing, doing HEP frequently- 4-6x day or every other hour as tolerated (she had not been doing this often).  OT goes over complete HEP as listed below for stretching non-involved fingers, A/AROM and blocking AROM for involved joints & she demo's back. OT also adjusts orthotic to no longer include MF (this should be tolerated at this point). She states understanding importance of home compliance.     PATIENT EDUCATION: Education details: See tx section above for details  Person educated: Patient Education method: Verbal Instruction, Teach back, Handouts  Education comprehension: States and demonstrates understanding, Additional Education required      HOME EXERCISE PROGRAM: Access Code: [email or web address] URL: [email or web address] Prepared by: Benito Mccreedy    GOALS: Goals reviewed with patient? Yes     SHORT TERM GOALS: (STG required if POC>30 days)   Pt will obtain protective, custom orthotic. Target date: 03/21/22 Goal status: MET   2.  Pt will demo/state understanding of initial HEP to improve pain levels and  prerequisite motion. Target date: 04/06/22 Goal status: INITIAL     LONG TERM GOALS:   Pt will improve functional ability by decreased impairment per Quick DASH assessment from 34% to 10% or better, for better quality of life. Target date: 05/18/22 Goal status: INITIAL   2.  Pt will improve grip strength in dom right hand to at least 40 lbs for functional use at home and in IADLs. Target date: 05/18/22 Goal status: INITIAL   3.  Pt will improve A/ROM in right ring and small finger TAM to at least 190*, to have functional motion for tasks like reach and grasp.  Target date: 05/18/22 Goal status: INITIAL         ASSESSMENT:   CLINICAL IMPRESSION: 03/30/22: She is very stiff and swollen still, she stated high pain (8/10) during stretches to DIP Js but had no flinching, pain posturing, or other over pain signs and likely has a low pain tolerance which may be holding her back a bit. We are primarily waiting on healing before "pushing" her more, but once more healed we will try RMO and other orthotics to encourage motion.   03/26/22: no added pains during today's session other than typical discomfort when stretching, etc.  She needs to get busy at home or she will become more stiff.   03/21/22: Patient is a 71 y.o. female who was seen today for occupational therapy evaluation for right hand fractures and decreased functional ability.       PLAN: OT FREQUENCY: 2x/week (less often at times to allow healing)    OT DURATION: 8 weeks (through 05/18/22)   PLANNED INTERVENTIONS: self care/ADL training, therapeutic exercise, therapeutic activity, neuromuscular re-education, manual therapy, scar mobilization, passive range of motion, splinting, electrical stimulation, ultrasound, fluidotherapy, compression bandaging, moist heat, cryotherapy, contrast bath, patient/family education,  coping strategies training, and DME and/or AE instructions   RECOMMENDED OTHER SERVICES: none now    CONSULTED AND  AGREED WITH PLAN OF CARE: Patient   PLAN FOR NEXT SESSION:  If she can fully extend her finger PIP, trim to hand-based orthosis ~4 weeks post op, upgrade AROM to PROM as tol once k-wires out. Try MH before motion next visit    Benito Mccreedy, OTR/L, CHT 03/30/2022, 9:53 AM

## 2022-03-30 NOTE — Progress Notes (Signed)
   Post-Op Visit Note   Patient: Alice Scott           Date of Birth: [birth year]           MRN: [id number] Visit Date: 03/30/2022 PCP: Vicenta Aly, FNP   Assessment & Plan:  Chief Complaint:  Chief Complaint  Patient presents with   Right Ring Finger - Routine Post Op   Right Little Finger - Routine Post Op   Visit Diagnoses:  1. Closed displaced fracture of proximal phalanx of right ring finger, initial encounter   2. Closed displaced fracture of proximal phalanx of right little finger, initial encounter     Plan: Patient is approximately 3 weeks out from her CRPP of the above injuries.  She is doing well.  Repeat x-rays show unchanged fracture alignment.  There is very mild erythema around the pin sites but no drainage and no pin loosening.  We will plan on pulling these next week.  We again discussed appropriate pin care and the importance of keeping the sites clean.  I'll see her again next week.   Follow-Up Instructions: No follow-ups on file.   Orders:  Orders Placed This Encounter  Procedures   XR Hand Complete Right   No orders of the defined types were placed in this encounter.   Imaging: No results found.  PMFS History: 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   Past Medical History:  Diagnosis Date   Anemia    Arthritis    Diabetes mellitus without complication (HCC)    GERD (gastroesophageal reflux disease)    Gout    Hypothyroidism     No family history on file.  Past Surgical History:  Procedure Laterality Date   ABDOMINAL HYSTERECTOMY     APPENDECTOMY     arthroscopic knee Right    CHOLECYSTECTOMY     CLOSED REDUCTION FINGER WITH PERCUTANEOUS PINNING Right 03/07/2022   Procedure: RIGHT RING AND SMALL FINGER CLOSED  REDUCTION  WITH PERCUTANEOUS PINNING;  Surgeon: Sherilyn Cooter, MD;  Location: [place];  Service: Orthopedics;  Laterality: Right;   GASTRIC BYPASS  2004   TOTAL KNEE ARTHROPLASTY Right 05/13/2013   Procedure: TOTAL KNEE ARTHROPLASTY;  Surgeon: Newt Minion, MD;  Location: [place];  Service: Orthopedics;  Laterality: Right;  Right Total Knee Arthroplasty   Social History   Occupational History   Not on file  Tobacco Use   Smoking status: Never   Smokeless tobacco: Never  Substance and Sexual Activity   Alcohol use: No   Drug use: No   Sexual activity: Not on file

## 2022-04-02 ENCOUNTER — Encounter: Payer: Self-pay | Admitting: Rehabilitative and Restorative Service Providers"

## 2022-04-02 ENCOUNTER — Ambulatory Visit: Payer: Medicare HMO | Admitting: Rehabilitative and Restorative Service Providers"

## 2022-04-02 DIAGNOSIS — M25641 Stiffness of right hand, not elsewhere classified: Secondary | ICD-10-CM | POA: Diagnosis not present

## 2022-04-02 DIAGNOSIS — M79641 Pain in right hand: Secondary | ICD-10-CM | POA: Diagnosis not present

## 2022-04-02 DIAGNOSIS — R600 Localized edema: Secondary | ICD-10-CM | POA: Diagnosis not present

## 2022-04-02 DIAGNOSIS — R278 Other lack of coordination: Secondary | ICD-10-CM

## 2022-04-02 DIAGNOSIS — M6281 Muscle weakness (generalized): Secondary | ICD-10-CM

## 2022-04-02 NOTE — Therapy (Signed)
OUTPATIENT OCCUPATIONAL THERAPY TREATMENT NOTE   Patient Name: Alice Scott MRN: [id number] DOB:30-May-1951, 71 y.o., female Today's Date: 04/02/2022  PCP: Vicenta Aly, FNP REFERRING PROVIDER: Dr. Sherilyn Cooter   END OF SESSION:   OT End of Session - 04/02/22 1512     Visit Number 4    Number of Visits 12    Date for OT Re-Evaluation 05/18/22    Authorization Type HU[employer]    Authorization - Visit Number 12    Progress Note Due on Visit 10    OT Start Time 1512    OT Stop Time 1541    OT Time Calculation (min) 29 min    Activity Tolerance Patient tolerated treatment well;Patient limited by fatigue;Patient limited by pain;No increased pain    Behavior During Therapy WFL for tasks assessed/performed               Past Medical History:  Diagnosis Date   Anemia    Arthritis    Diabetes mellitus without complication (HCC)    GERD (gastroesophageal reflux disease)    Gout    Hypothyroidism    Past Surgical History:  Procedure Laterality Date   ABDOMINAL HYSTERECTOMY     APPENDECTOMY     arthroscopic knee Right    CHOLECYSTECTOMY     CLOSED REDUCTION FINGER WITH PERCUTANEOUS PINNING Right 03/07/2022   Procedure: RIGHT RING AND SMALL FINGER CLOSED REDUCTION  WITH PERCUTANEOUS PINNING;  Surgeon: Sherilyn Cooter, MD;  Location: [place];  Service: Orthopedics;  Laterality: Right;   GASTRIC BYPASS  2004   TOTAL KNEE ARTHROPLASTY Right 05/13/2013   Procedure: TOTAL KNEE ARTHROPLASTY;  Surgeon: Newt Minion, MD;  Location: [place];  Service: Orthopedics;  Laterality: Right;  Right Total Knee Arthroplasty   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    ONSET DATE: 03/07/22 DOS    REFERRING DIAG:  T51.761Y (ICD-10-CM) - Closed  displaced fracture of proximal phalanx of right little finger, initial encounter  S62.614A (ICD-10-CM) - Closed displaced fracture of proximal phalanx of right ring finger, initial encounter    THERAPY DIAG:  Stiffness of right hand, not elsewhere classified  Pain in right hand  Localized edema  Other lack of coordination  Muscle weakness (generalized)  Rationale for Evaluation and Treatment Rehabilitation  PERTINENT HISTORY: Per MD: Right ring and small finger proximal phalangeal fx s/p closed reduction and pinning.  Thermoplast splint.  Edema control and ROM."   PRECAUTIONS:  4+ weeks post-op now; light AROM and blocking as tol, full AROM at 4 weeks (pins out) and start light FNL activities, wait until 5 weeks out for PROM as tolerated   WEIGHT BEARING RESTRICTIONS Yes NWB right hand for now   SUBJECTIVE:  She states having increased pain around pin sites mainly, and OT advises her to rest more often possibly, in orthotic.    PAIN:  Are you having pain? Yes around pin sites   Rating: 5-6/10 at rest now (burning, throbbing)   OBJECTIVE: (All objective assessments below are from initial evaluation on: 03/21/22 unless otherwise specified.)   HAND DOMINANCE: Right    ADLs: Overall ADLs: States decreased ability to grab, hold household objects, pain and inability to pen containers, perform all FMS tasks, etc.      FUNCTIONAL  OUTCOME MEASURES: Eval: Quck DASH 34% impairment today    UPPER EXTREMITY ROM     04/02/22: 0cm gap in opposition to [facility] today    Active ROM Right eval Right 03/26/22 Right 03/30/22  Wrist flexion 64 50 35  Wrist extension 41 50 45  (Blank rows = not tested)   Active ROM Right eval Right 03/30/22  Ring MCP (0-90) 0- 26 (-19*)- 51  Ring PIP (0-100) 0- 5 0- 19  Ring DIP (0-70) 0- 9 0- 9  Little MCP (0-90) 0-20  (-16*) - 43  Little PIP (0-100) 0- 1 0-0  Little DIP (0-70) 0- 6 0- 9  (Blank rows = not tested)     UPPER EXTREMITY MMT:    Eval: NT  due to fx    MMT Right eval  Elbow flexion    Elbow extension    Wrist flexion    Wrist extension    (Blank rows = not tested)   HAND FUNCTION: Eval: Grip strength Right: TBD lbs, Left: TBD lbs    COORDINATION: Eval: Box and Blocks Test: Right TBD Blocks today (53 is [facility]); 9 Hole Peg Test Right: TBD sec (26 sec is WFL)    SENSATION: Eval:  Light touch intact today, though possibly very mildly reduced due to swelling    EDEMA:             04/02/22: 20.4cm circumferentially around MCP Js today Eval:  Mildly swollen in hand and wrist today, 20.1cm circumferentially around MCP Js   COGNITION: Overall cognitive status: WFL for evaluation today    OBSERVATIONS:           03/30/22: Less guarded now but stiffness in wrist has increased. IP Js bending better, but MCP Js not fully extending now.      03/26/22: She has more guarding from pain today, pushes back against OT when OT trying to stretch her, not relaxing hand/wrist/fingers. She is cued/educated against this.   Eval: pins sites look clean and healing, 1 pin is pressing somewhat deeply in dorsum of metacarpal area and may cause some irritation, but right now is fine.      TODAY'S TREATMENT:  04/02/22:  OT reviews self-care pin-site care with cleaning with Q-tip, after washing with soap and water dry well, then keep moist with Vaseline or bacitracin. She states understanding. OT also uses MH for 5 mins with review of HEP (no skin irritation), then does manual PROM to tips of fingers (DIP only) and wrist as tolerated. She states understanding HEP and need to continue on while respecting no heavy lifting and no causing pain, even after k-wires are removed.   03/30/22: OT reviews HEP with her as below. She was told to be very cautious at MCP J, AROM ok but no stretches there yet. Some other suggestions/corrections were made to wrist stretches and her extension AROM at MCP J. OT reminds her not to lift anything heavy yet, as she describes  taking off brace to put on seatbelt and do other light tasks. OT also performs light manual stretches to rt hand IP Js as she tolerates today with some pain reported and so she uses ice 4 mins at end which helps with soreness (that time not included/billed for).   Exercises - Standing Shoulder Flexion Full Range  - 4-6 x daily - 1-2 sets - 10-15 reps - Bend and Pull Back Wrist SLOWLY  - 4-6 x daily - 1-2 sets - 10-15 reps - Seated Wrist Flexion  with Overpressure  - 4-6 x daily - 3-5 reps - 15 sec hold - Wrist Extension Stretch Pronated  - 4-6 x daily - 3-5 reps - 15 hold - "Windshield Wipers"   - 4-6 x daily - 10-15 reps - Tendon Glides  - 4-6 x daily - 10-15 reps - Hand AROM PIP Blocking  - 4-6 x daily - 1 sets - 10-15 reps - Seated Finger DIP Flexion AROM with Blocking  - 4-6 x daily - 1 sets - 10-15 reps - Touch Thumb to [facility] Finger  - 4-6 x daily - 10 reps - Seated Thumb MP Flexion PROM  - 4-6 x daily - 3-5 reps - 15 sec hold - Seated Single Digit Intrinsic Stretch  - 4-6 x daily - 3-5 reps - 15-20 sec hold - Seated Finger Composite Flexion Stretch  - 4-6 x daily - 3-5 reps - 15 hold - Hand AROM Reverse Blocking  - 4-6 x daily - 1 sets - 10-15 reps   03/26/22: Due to high guarding from pain and likely some pain and swelling as well, her arm is becoming more stiff (seen through wrist, fingers tighter today). She is hesitant to come in more often, as she states living far away. OT edu / stresses importance of not guarding, relaxing, doing HEP frequently- 4-6x day or every other hour as tolerated (she had not been doing this often).  OT goes over complete HEP as listed below for stretching non-involved fingers, A/AROM and blocking AROM for involved joints & she demo's back. OT also adjusts orthotic to no longer include MF (this should be tolerated at this point). She states understanding importance of home compliance.     PATIENT EDUCATION: Education details: See tx section above for details   Person educated: Patient Education method: Verbal Instruction, Teach back, Handouts  Education comprehension: States and demonstrates understanding, Additional Education required      HOME EXERCISE PROGRAM: Access Code: [email or web address] URL: [email or web address] Prepared by: Benito Mccreedy    GOALS: Goals reviewed with patient? Yes     SHORT TERM GOALS: (STG required if POC>30 days)   Pt will obtain protective, custom orthotic. Target date: 03/21/22 Goal status: MET   2.  Pt will demo/state understanding of initial HEP to improve pain levels and prerequisite motion. Target date: 04/06/22 Goal status: INITIAL     LONG TERM GOALS:   Pt will improve functional ability by decreased impairment per Quick DASH assessment from 34% to 10% or better, for better quality of life. Target date: 05/18/22 Goal status: INITIAL   2.  Pt will improve grip strength in dom right hand to at least 40 lbs for functional use at home and in IADLs. Target date: 05/18/22 Goal status: INITIAL   3.  Pt will improve A/ROM in right ring and small finger TAM to at least 190*, to have functional motion for tasks like reach and grasp.  Target date: 05/18/22 Goal status: INITIAL         ASSESSMENT:   CLINICAL IMPRESSION: 04/02/22: She is doing well for her phase of healing and will likely be doing much better after k-wires are out. We are limited on exercises that can be done until ~6 weeks post-op and depending on healing.   03/30/22: She is very stiff and swollen still, she stated high pain (8/10) during stretches to DIP Js but had no flinching, pain posturing, or other over pain signs and likely has a low pain tolerance which may be holding her  back a bit. We are primarily waiting on healing before "pushing" her more, but once more healed we will try RMO and other orthotics to encourage motion.     PLAN: OT FREQUENCY: 2x/week (less often at times to allow healing)    OT DURATION: 8 weeks  (through 05/18/22)   PLANNED INTERVENTIONS: self care/ADL training, therapeutic exercise, therapeutic activity, neuromuscular re-education, manual therapy, scar mobilization, passive range of motion, splinting, electrical stimulation, ultrasound, fluidotherapy, compression bandaging, moist heat, cryotherapy, contrast bath, patient/family education, coping strategies training, and DME and/or AE instructions   RECOMMENDED OTHER SERVICES: none now    CONSULTED AND AGREED WITH PLAN OF CARE: Patient   PLAN FOR NEXT SESSION:  After k-wires out, next week, add full arc ROM to tolerance and likely wait until 6 weeks out for PROM stretches to MCP J and PIP J. Also wait until ~week 6 for dynamics, if needed. Do light fnl activities as tolerated next session   Benito Mccreedy, OTR/L, CHT 04/02/2022, 3:50 PM

## 2022-04-05 ENCOUNTER — Encounter: Payer: Medicare HMO | Admitting: Rehabilitative and Restorative Service Providers"

## 2022-04-06 ENCOUNTER — Ambulatory Visit (INDEPENDENT_AMBULATORY_CARE_PROVIDER_SITE_OTHER): Payer: Medicare HMO | Admitting: Orthopedic Surgery

## 2022-04-06 ENCOUNTER — Ambulatory Visit (INDEPENDENT_AMBULATORY_CARE_PROVIDER_SITE_OTHER): Payer: Medicare HMO

## 2022-04-06 ENCOUNTER — Encounter: Payer: Self-pay | Admitting: Orthopedic Surgery

## 2022-04-06 DIAGNOSIS — S62614A Displaced fracture of proximal phalanx of right ring finger, initial encounter for closed fracture: Secondary | ICD-10-CM

## 2022-04-06 DIAGNOSIS — S62616A Displaced fracture of proximal phalanx of right little finger, initial encounter for closed fracture: Secondary | ICD-10-CM

## 2022-04-06 NOTE — Progress Notes (Signed)
   Post-Op Visit Note   Patient: Alice Scott           Date of Birth: February 07, 1951           MRN: [id number] Visit Date: 04/06/2022 PCP: Elizabeth Palau, FNP   Assessment & Plan:  Chief Complaint:  Chief Complaint  Patient presents with   Right Hand - Routine Post Op   Visit Diagnoses:  1. Closed displaced fracture of proximal phalanx of right ring finger, initial encounter   2. Closed displaced fracture of proximal phalanx of right little finger, initial encounter     Plan: Patient is 4.5 weeks s/p CRPP of right ring and small finger P1 fractures.  Her k-wires were removed today.  She will continue therapy to try to regain ROM of these fingers.  Repeat x-rays show no change in fracture alignment.  I can see her back in another two weeks once she's worked with therapy.   Follow-Up Instructions: No follow-ups on file.   Orders:  Orders Placed This Encounter  Procedures   XR Hand Complete Right   No orders of the defined types were placed in this encounter.   Imaging: No results found.  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 No pertinent family history.  Past Surgical History:  Procedure Laterality Date   ABDOMINAL HYSTERECTOMY     APPENDECTOMY     arthroscopic knee Right    CHOLECYSTECTOMY     CLOSED REDUCTION FINGER WITH PERCUTANEOUS PINNING Right 03/07/2022   Procedure: RIGHT RING AND SMALL FINGER CLOSED REDUCTION  WITH PERCUTANEOUS PINNING;  Surgeon: Marlyne Beards, MD;  Location: [facility];  Service:  Orthopedics;  Laterality: Right;   GASTRIC BYPASS  2004   TOTAL KNEE ARTHROPLASTY Right 05/13/2013   Procedure: TOTAL KNEE ARTHROPLASTY;  Surgeon: Nadara Mustard, MD;  Location: [facility];  Service: Orthopedics;  Laterality: Right;  Right Total Knee Arthroplasty   Social History   Occupational History   Not on file  Tobacco Use   Smoking status: Never   Smokeless tobacco: Never  Substance and Sexual Activity   Alcohol use: No   Drug use: No   Sexual activity: Not on file

## 2022-04-09 ENCOUNTER — Encounter: Payer: Medicare HMO | Admitting: Rehabilitative and Restorative Service Providers"

## 2022-04-09 ENCOUNTER — Ambulatory Visit: Payer: Medicare HMO | Admitting: Rehabilitative and Restorative Service Providers"

## 2022-04-09 ENCOUNTER — Encounter: Payer: Self-pay | Admitting: Rehabilitative and Restorative Service Providers"

## 2022-04-09 DIAGNOSIS — M79641 Pain in right hand: Secondary | ICD-10-CM

## 2022-04-09 DIAGNOSIS — R600 Localized edema: Secondary | ICD-10-CM | POA: Diagnosis not present

## 2022-04-09 DIAGNOSIS — M6281 Muscle weakness (generalized): Secondary | ICD-10-CM

## 2022-04-09 DIAGNOSIS — M25641 Stiffness of right hand, not elsewhere classified: Secondary | ICD-10-CM

## 2022-04-09 DIAGNOSIS — R278 Other lack of coordination: Secondary | ICD-10-CM

## 2022-04-09 NOTE — Therapy (Signed)
OUTPATIENT OCCUPATIONAL THERAPY TREATMENT NOTE   Patient Name: Alice Scott MRN: [id number] DOB:[birth year], 71 y.o., female Today's Date: 04/09/2022  PCP: Vicenta Aly, FNP REFERRING PROVIDER: Dr. Sherilyn Cooter   END OF SESSION:   OT End of Session - 04/09/22 1438     Visit Number 5    Number of Visits 12    Date for OT Re-Evaluation 05/18/22    Authorization Type [employer]    Authorization - Visit Number 12    Progress Note Due on Visit 10    OT Start Time 4008    OT Stop Time 1515    OT Time Calculation (min) 37 min    Activity Tolerance Patient tolerated treatment well;Patient limited by fatigue;Patient limited by pain;No increased pain    Behavior During Therapy WFL for tasks assessed/performed                Past Medical History:  Diagnosis Date   Anemia    Arthritis    Diabetes mellitus without complication (HCC)    GERD (gastroesophageal reflux disease)    Gout    Hypothyroidism    Past Surgical History:  Procedure Laterality Date   ABDOMINAL HYSTERECTOMY     APPENDECTOMY     arthroscopic knee Right    CHOLECYSTECTOMY     CLOSED REDUCTION FINGER WITH PERCUTANEOUS PINNING Right 03/07/2022   Procedure: RIGHT RING AND SMALL FINGER CLOSED REDUCTION  WITH PERCUTANEOUS PINNING;  Surgeon: Sherilyn Cooter, MD;  Location: [place];  Service: Orthopedics;  Laterality: Right;   GASTRIC BYPASS  2004   TOTAL KNEE ARTHROPLASTY Right 05/13/2013   Procedure: TOTAL KNEE ARTHROPLASTY;  Surgeon: Newt Minion, MD;  Location: [place];  Service: Orthopedics;  Laterality: Right;  Right Total Knee Arthroplasty   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    ONSET DATE: 03/07/22 DOS    REFERRING DIAG:  Q76.195K (ICD-10-CM) - Closed  displaced fracture of proximal phalanx of right little finger, initial encounter  S62.614A (ICD-10-CM) - Closed displaced fracture of proximal phalanx of right ring finger, initial encounter    THERAPY DIAG:  No diagnosis found.  Rationale for Evaluation and Treatment Rehabilitation  PERTINENT HISTORY: Per MD: Right ring and small finger proximal phalangeal fx s/p closed reduction and pinning.  Thermoplast splint.  Edema control and ROM."   PRECAUTIONS:  5 weeks post-op now; light AROM and blocking as tol, full AROM at 4 weeks (pins out) and start light FNL activities, wait until 5 weeks out for PROM as tolerated   WEIGHT BEARING RESTRICTIONS Yes NWB right hand for now   SUBJECTIVE:  She states no more pain, not wearing orthotic in day, really. K-wires are out.    PAIN:  Are you having pain? No- only itching   Rating: 0/10 at rest now    OBJECTIVE: (All objective assessments below are from initial evaluation on: 03/21/22 unless otherwise specified.)   HAND DOMINANCE: Right    ADLs: Overall ADLs: States decreased ability to grab, hold household objects, pain and inability to pen containers, perform all FMS tasks, etc.      FUNCTIONAL OUTCOME MEASURES: Eval: Quck DASH 34% impairment today    UPPER EXTREMITY ROM     04/02/22: 0cm gap in opposition to [facility] today  Active ROM Right eval Right 03/26/22 Right 03/30/22 Right 04/09/22  Wrist flexion 64 50 35 45  Wrist extension 41 50 45 51  (Blank rows = not tested)   Active ROM Right eval Right 03/30/22 Right 04/09/22  Ring MCP (0-90) 0- 26 (-19*)- 51 0- 41  Ring PIP (0-100) 0- 5 0- 19 (-10) - 37  Ring DIP (0-70) 0- 9 0- 9 0- 9  Little MCP (0-90) 0-20  (-16*) - 43 0- 41  Little PIP (0-100) 0- 1 0-0 0- 10  Little DIP (0-70) 0- 6 0- 9 0- 9  (Blank rows = not tested)     UPPER EXTREMITY MMT:    Eval: NT due to fx    MMT Right eval  Elbow flexion    Elbow extension    Wrist flexion    Wrist extension    (Blank rows =  not tested)   HAND FUNCTION: Eval: Grip strength Right: TBD lbs, Left: TBD lbs    COORDINATION: Eval: Box and Blocks Test: Right TBD Blocks today (53 is [facility]); 9 Hole Peg Test Right: TBD sec (26 sec is WFL)    SENSATION: Eval:  Light touch intact today, though possibly very mildly reduced due to swelling    EDEMA:             04/02/22: 20.4cm circumferentially around MCP Js today Eval:  Mildly swollen in hand and wrist today, 20.1cm circumferentially around MCP Js   COGNITION: Overall cognitive status: WFL for evaluation today    OBSERVATIONS:           03/30/22: Less guarded now but stiffness in wrist has increased. IP Js bending better, but MCP Js not fully extending now.      03/26/22: She has more guarding from pain today, pushes back against OT when OT trying to stretch her, not relaxing hand/wrist/fingers. She is cued/educated against this.   Eval: pins sites look clean and healing, 1 pin is pressing somewhat deeply in dorsum of metacarpal area and may cause some irritation, but right now is fine.      TODAY'S TREATMENT:  04/09/22: She performs AROM now without k-wires, is a bit looser at wrist and fingers now. OT reviews HEP with her (as below) adding AAROM, very light PROM to PIP J now (no pain). OT supplies buddy straps for RF and SF with edu to only do light activities still (with no pain) and trims down orthotic for night use in full ext and during day PRN to rest. She states understanding all.   Exercises - Seated Wrist Flexion with Overpressure  - 4-6 x daily - 3-5 reps - 15 sec hold - Wrist Extension Stretch Pronated  - 4-6 x daily - 3-5 reps - 15 hold - Tendon Glides  - 4-6 x daily - 10-15 reps - Hand AROM PIP Blocking  - 4-6 x daily - 1 sets - 10-15 reps - Seated Finger DIP Flexion AROM with Blocking  - 4-6 x daily - 1 sets - 10-15 reps - Touch Thumb to [facility] Finger  - 4-6 x daily - 10 reps - Seated Thumb MP Flexion PROM  - 4-6 x daily - 3-5 reps - 15 sec hold - Hand PROM  MCP Flexion  - 4-6 x daily - 3-5 reps - 15 hold - Seated Single Digit Intrinsic Stretch  - 4-6 x daily - 3-5 reps - 15-20 sec hold - Seated Finger Composite Flexion Stretch  - 4-6 x daily - 3-5  reps - 15 hold - Hand AROM Reverse Blocking  - 4-6 x daily - 1 sets - 10-15 reps  04/02/22:  OT reviews self-care pin-site care with cleaning with Q-tip, after washing with soap and water dry well, then keep moist with Vaseline or bacitracin. She states understanding. OT also uses MH for 5 mins with review of HEP (no skin irritation), then does manual PROM to tips of fingers (DIP only) and wrist as tolerated. She states understanding HEP and need to continue on while respecting no heavy lifting and no causing pain, even after k-wires are removed.   03/30/22: OT reviews HEP with her as below. She was told to be very cautious at MCP J, AROM ok but no stretches there yet. Some other suggestions/corrections were made to wrist stretches and her extension AROM at MCP J. OT reminds her not to lift anything heavy yet, as she describes taking off brace to put on seatbelt and do other light tasks. OT also performs light manual stretches to rt hand IP Js as she tolerates today with some pain reported and so she uses ice 4 mins at end which helps with soreness (that time not included/billed for).   Exercises - Standing Shoulder Flexion Full Range  - 4-6 x daily - 1-2 sets - 10-15 reps - Bend and Pull Back Wrist SLOWLY  - 4-6 x daily - 1-2 sets - 10-15 reps - Seated Wrist Flexion with Overpressure  - 4-6 x daily - 3-5 reps - 15 sec hold - Wrist Extension Stretch Pronated  - 4-6 x daily - 3-5 reps - 15 hold - "Windshield Wipers"   - 4-6 x daily - 10-15 reps - Tendon Glides  - 4-6 x daily - 10-15 reps - Hand AROM PIP Blocking  - 4-6 x daily - 1 sets - 10-15 reps - Seated Finger DIP Flexion AROM with Blocking  - 4-6 x daily - 1 sets - 10-15 reps - Touch Thumb to [facility] Finger  - 4-6 x daily - 10 reps - Seated Thumb MP Flexion  PROM  - 4-6 x daily - 3-5 reps - 15 sec hold - Seated Single Digit Intrinsic Stretch  - 4-6 x daily - 3-5 reps - 15-20 sec hold - Seated Finger Composite Flexion Stretch  - 4-6 x daily - 3-5 reps - 15 hold - Hand AROM Reverse Blocking  - 4-6 x daily - 1 sets - 10-15 reps   03/26/22: Due to high guarding from pain and likely some pain and swelling as well, her arm is becoming more stiff (seen through wrist, fingers tighter today). She is hesitant to come in more often, as she states living far away. OT edu / stresses importance of not guarding, relaxing, doing HEP frequently- 4-6x day or every other hour as tolerated (she had not been doing this often).  OT goes over complete HEP as listed below for stretching non-involved fingers, A/AROM and blocking AROM for involved joints & she demo's back. OT also adjusts orthotic to no longer include MF (this should be tolerated at this point). She states understanding importance of home compliance.     PATIENT EDUCATION: Education details: See tx section above for details  Person educated: Patient Education method: Verbal Instruction, Teach back, Handouts  Education comprehension: States and demonstrates understanding, Additional Education required      HOME EXERCISE PROGRAM: Access Code: [email or web address] URL: [email or web address] Prepared by: Benito Mccreedy    GOALS: Goals reviewed with patient? Yes  SHORT TERM GOALS: (STG required if POC>30 days)   Pt will obtain protective, custom orthotic. Target date: 03/21/22 Goal status: MET   2.  Pt will demo/state understanding of initial HEP to improve pain levels and prerequisite motion. Target date: 04/06/22 Goal status: INITIAL     LONG TERM GOALS:   Pt will improve functional ability by decreased impairment per Quick DASH assessment from 34% to 10% or better, for better quality of life. Target date: 05/18/22 Goal status: INITIAL   2.  Pt will improve grip strength in dom right hand  to at least 40 lbs for functional use at home and in IADLs. Target date: 05/18/22 Goal status: INITIAL   3.  Pt will improve A/ROM in right ring and small finger TAM to at least 190*, to have functional motion for tasks like reach and grasp.  Target date: 05/18/22 Goal status: INITIAL         ASSESSMENT:   CLINICAL IMPRESSION: 04/09/22: She is still very stiff but slowly improving, admits to not doing HEP as often as asked. Is asked again to be strict about this.   04/02/22: She is doing well for her phase of healing and will likely be doing much better after k-wires are out. We are limited on exercises that can be done until ~6 weeks post-op and depending on healing.     PLAN: OT FREQUENCY: 2x/week (less often at times to allow healing)    OT DURATION: 8 weeks (through 05/18/22)   PLANNED INTERVENTIONS: self care/ADL training, therapeutic exercise, therapeutic activity, neuromuscular re-education, manual therapy, scar mobilization, passive range of motion, splinting, electrical stimulation, ultrasound, fluidotherapy, compression bandaging, moist heat, cryotherapy, contrast bath, patient/family education, coping strategies training, and DME and/or AE instructions   RECOMMENDED OTHER SERVICES: none now    CONSULTED AND AGREED WITH PLAN OF CARE: Patient   PLAN FOR NEXT SESSION:  Consider RMO if MCP Js improve compared to IP Js. Consider dynamics as well, starting next week. Do light fnl activities as tolerated next session   Benito Mccreedy, OTR/L, CHT 04/09/2022, 5:08 PM

## 2022-04-19 ENCOUNTER — Ambulatory Visit: Payer: Medicare HMO | Admitting: Rehabilitative and Restorative Service Providers"

## 2022-04-19 ENCOUNTER — Encounter: Payer: Self-pay | Admitting: Rehabilitative and Restorative Service Providers"

## 2022-04-19 DIAGNOSIS — R600 Localized edema: Secondary | ICD-10-CM | POA: Diagnosis not present

## 2022-04-19 DIAGNOSIS — M79641 Pain in right hand: Secondary | ICD-10-CM | POA: Diagnosis not present

## 2022-04-19 DIAGNOSIS — M6281 Muscle weakness (generalized): Secondary | ICD-10-CM | POA: Diagnosis not present

## 2022-04-19 DIAGNOSIS — R278 Other lack of coordination: Secondary | ICD-10-CM

## 2022-04-19 DIAGNOSIS — M25641 Stiffness of right hand, not elsewhere classified: Secondary | ICD-10-CM

## 2022-04-19 NOTE — Therapy (Signed)
OUTPATIENT OCCUPATIONAL THERAPY TREATMENT NOTE   Patient Name: Alice Scott MRN: [id number] DOB:07/16/1951, 71 y.o., female Today's Date: 04/19/2022  PCP: Vicenta Aly, FNP REFERRING PROVIDER: Dr. Sherilyn Cooter   END OF SESSION:   OT End of Session - 04/19/22 0854     Visit Number 6    Number of Visits 12    Date for OT Re-Evaluation 05/18/22    Authorization Type HUMA[employer]    Authorization - Visit Number 12    Progress Note Due on Visit 10    OT Start Time 0853    OT Stop Time 0928    OT Time Calculation (min) 35 min    Activity Tolerance Patient tolerated treatment well;Patient limited by fatigue;Patient limited by pain;No increased pain    Behavior During Therapy WFL for tasks assessed/performed                 Past Medical History:  Diagnosis Date   Anemia    Arthritis    Diabetes mellitus without complication (HCC)    GERD (gastroesophageal reflux disease)    Gout    Hypothyroidism    Past Surgical History:  Procedure Laterality Date   ABDOMINAL HYSTERECTOMY     APPENDECTOMY     arthroscopic knee Right    CHOLECYSTECTOMY     CLOSED REDUCTION FINGER WITH PERCUTANEOUS PINNING Right 03/07/2022   Procedure: RIGHT RING AND SMALL FINGER CLOSED REDUCTION  WITH PERCUTANEOUS PINNING;  Surgeon: Sherilyn Cooter, MD;  Location: [place];  Service: Orthopedics;  Laterality: Right;   GASTRIC BYPASS  2004   TOTAL KNEE ARTHROPLASTY Right 05/13/2013   Procedure: TOTAL KNEE ARTHROPLASTY;  Surgeon: Newt Minion, MD;  Location: [place];  Service: Orthopedics;  Laterality: Right;  Right Total Knee Arthroplasty   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    ONSET DATE: 03/07/22 DOS    REFERRING DIAG:  Y77.412I (ICD-10-CM) - Closed  displaced fracture of proximal phalanx of right little finger, initial encounter  S62.614A (ICD-10-CM) - Closed displaced fracture of proximal phalanx of right ring finger, initial encounter    THERAPY DIAG:  Pain in right hand  Stiffness of right hand, not elsewhere classified  Localized edema  Muscle weakness (generalized)  Other lack of coordination  Rationale for Evaluation and Treatment Rehabilitation  PERTINENT HISTORY: Per MD: Right ring and small finger proximal phalangeal fx s/p closed reduction and pinning.  Thermoplast splint.  Edema control and ROM."   PRECAUTIONS:  6 weeks post-op now; light AROM and blocking as tol, full AROM at 4 weeks (pins out) and start light FNL activities, wait until 5 weeks out for PROM as tolerated   WEIGHT BEARING RESTRICTIONS Yes NWB right hand for now   SUBJECTIVE:  She states wrist has been sore since weaning FA orthotic and RF is getting better but SF is stubborn motion still and painful. She also has new c/o FCU pain at wrist.   PAIN:  Are you having pain?  Yes  Rating: 4-5/10 at rest now    OBJECTIVE: (All objective assessments below are from initial evaluation on: 03/21/22 unless otherwise specified.)   HAND DOMINANCE: Right    ADLs: Overall ADLs: States decreased ability to grab, hold household objects, pain and inability to pen containers, perform all  FMS tasks, etc.      FUNCTIONAL OUTCOME MEASURES: Eval: Quck DASH 34% impairment today    UPPER EXTREMITY ROM     04/02/22: 0cm gap in opposition to [facility] today    Active ROM Right eval Right 03/26/22 Right 03/30/22 Right 04/09/22  Wrist flexion 64 50 35 45  Wrist extension 41 50 45 51  (Blank rows = not tested)   Active ROM Right eval Right 03/30/22 Right 04/09/22  Ring MCP (0-90) 0- 26 (-19*)- 51 0- 41  Ring PIP (0-100) 0- 5 0- 19 (-10) - 37  Ring DIP (0-70) 0- 9 0- 9 0- 9  Little MCP (0-90) 0-20  (-16*) - 43 0- 41  Little PIP (0-100) 0- 1 0-0 0- 10  Little DIP (0-70)  0- 6 0- 9 0- 9  (Blank rows = not tested)     UPPER EXTREMITY MMT:    Eval: NT due to fx    MMT Right eval  Elbow flexion    Elbow extension    Wrist flexion    Wrist extension    (Blank rows = not tested)   HAND FUNCTION: Eval: Grip strength Right: TBD lbs, Left: TBD lbs    COORDINATION: Eval: Box and Blocks Test: Right TBD Blocks today (53 is [facility]); 9 Hole Peg Test Right: TBD sec (26 sec is WFL)    SENSATION: Eval:  Light touch intact today, though possibly very mildly reduced due to swelling    EDEMA:             04/02/22: 20.4cm circumferentially around MCP Js today Eval:  Mildly swollen in hand and wrist today, 20.1cm circumferentially around MCP Js   COGNITION: Overall cognitive status: WFL for evaluation today    OBSERVATIONS:           03/30/22: Less guarded now but stiffness in wrist has increased. IP Js bending better, but MCP Js not fully extending now.      03/26/22: She has more guarding from pain today, pushes back against OT when OT trying to stretch her, not relaxing hand/wrist/fingers. She is cued/educated against this.   Eval: pins sites look clean and healing, 1 pin is pressing somewhat deeply in dorsum of metacarpal area and may cause some irritation, but right now is fine.      TODAY'S TREATMENT:  04/19/22: Due to increased pain/soreness, OT uses moist heat with review of self-care safety expectations to not be lifting heavy, rest and use ice when sore, wear orthotic periodically to rest painful/sore fingers, etc.  She states using hand all day now and it's no surprise she is having increased pain and continued swelling. She is cautioned about repetitive activity, especially at now painful wrist (FCU).  OT then does IASTM and manual stretches to help stretch and lengthen FCU in sore wrist, stretches fingers which is not painful to her after MH and MFR. OT also edu on retrograde massage for edema and adds to HEP PRN.  She states feeling much better at the end. OT  also edu for increasing rest as needed (wearing orthotic periodically in day to rest fingers and using pre-fab wrist brace), also adapting activities to avoid wrist ext repetitively. She states understanding.   04/09/22: She performs AROM now without k-wires, is a bit looser at wrist and fingers now. OT reviews HEP with her (as below) adding AAROM, very light PROM to PIP J now (no pain). OT supplies buddy straps for RF and SF with edu to only  do light activities still (with no pain) and trims down orthotic for night use in full ext and during day PRN to rest. She states understanding all.   Exercises - Seated Wrist Flexion with Overpressure  - 4-6 x daily - 3-5 reps - 15 sec hold - Wrist Extension Stretch Pronated  - 4-6 x daily - 3-5 reps - 15 hold - Tendon Glides  - 4-6 x daily - 10-15 reps - Hand AROM PIP Blocking  - 4-6 x daily - 1 sets - 10-15 reps - Seated Finger DIP Flexion AROM with Blocking  - 4-6 x daily - 1 sets - 10-15 reps - Touch Thumb to [facility] Finger  - 4-6 x daily - 10 reps - Seated Thumb MP Flexion PROM  - 4-6 x daily - 3-5 reps - 15 sec hold - Hand PROM MCP Flexion  - 4-6 x daily - 3-5 reps - 15 hold - Seated Single Digit Intrinsic Stretch  - 4-6 x daily - 3-5 reps - 15-20 sec hold - Seated Finger Composite Flexion Stretch  - 4-6 x daily - 3-5 reps - 15 hold - Hand AROM Reverse Blocking  - 4-6 x daily - 1 sets - 10-15 reps  04/02/22:  OT reviews self-care pin-site care with cleaning with Q-tip, after washing with soap and water dry well, then keep moist with Vaseline or bacitracin. She states understanding. OT also uses MH for 5 mins with review of HEP (no skin irritation), then does manual PROM to tips of fingers (DIP only) and wrist as tolerated. She states understanding HEP and need to continue on while respecting no heavy lifting and no causing pain, even after k-wires are removed.   03/30/22: OT reviews HEP with her as below. She was told to be very cautious at MCP J, AROM ok but  no stretches there yet. Some other suggestions/corrections were made to wrist stretches and her extension AROM at MCP J. OT reminds her not to lift anything heavy yet, as she describes taking off brace to put on seatbelt and do other light tasks. OT also performs light manual stretches to rt hand IP Js as she tolerates today with some pain reported and so she uses ice 4 mins at end which helps with soreness (that time not included/billed for).   Exercises - Standing Shoulder Flexion Full Range  - 4-6 x daily - 1-2 sets - 10-15 reps - Bend and Pull Back Wrist SLOWLY  - 4-6 x daily - 1-2 sets - 10-15 reps - Seated Wrist Flexion with Overpressure  - 4-6 x daily - 3-5 reps - 15 sec hold - Wrist Extension Stretch Pronated  - 4-6 x daily - 3-5 reps - 15 hold - "Windshield Wipers"   - 4-6 x daily - 10-15 reps - Tendon Glides  - 4-6 x daily - 10-15 reps - Hand AROM PIP Blocking  - 4-6 x daily - 1 sets - 10-15 reps - Seated Finger DIP Flexion AROM with Blocking  - 4-6 x daily - 1 sets - 10-15 reps - Touch Thumb to [facility] Finger  - 4-6 x daily - 10 reps - Seated Thumb MP Flexion PROM  - 4-6 x daily - 3-5 reps - 15 sec hold - Seated Single Digit Intrinsic Stretch  - 4-6 x daily - 3-5 reps - 15-20 sec hold - Seated Finger Composite Flexion Stretch  - 4-6 x daily - 3-5 reps - 15 hold - Hand AROM Reverse Blocking  - 4-6 x  daily - 1 sets - 10-15 reps   03/26/22: Due to high guarding from pain and likely some pain and swelling as well, her arm is becoming more stiff (seen through wrist, fingers tighter today). She is hesitant to come in more often, as she states living far away. OT edu / stresses importance of not guarding, relaxing, doing HEP frequently- 4-6x day or every other hour as tolerated (she had not been doing this often).  OT goes over complete HEP as listed below for stretching non-involved fingers, A/AROM and blocking AROM for involved joints & she demo's back. OT also adjusts orthotic to no longer  include MF (this should be tolerated at this point). She states understanding importance of home compliance.     PATIENT EDUCATION: Education details: See tx section above for details  Person educated: Patient Education method: Verbal Instruction, Teach back, Handouts  Education comprehension: States and demonstrates understanding, Additional Education required      HOME EXERCISE PROGRAM: Access Code: [email or web address] URL: [email or web address] Prepared by: Benito Mccreedy    GOALS: Goals reviewed with patient? Yes     SHORT TERM GOALS: (STG required if POC>30 days)   Pt will obtain protective, custom orthotic. Target date: 03/21/22 Goal status: MET   2.  Pt will demo/state understanding of initial HEP to improve pain levels and prerequisite motion. Target date: 04/06/22 Goal status: INITIAL     LONG TERM GOALS:   Pt will improve functional ability by decreased impairment per Quick DASH assessment from 34% to 10% or better, for better quality of life. Target date: 05/18/22 Goal status: INITIAL   2.  Pt will improve grip strength in dom right hand to at least 40 lbs for functional use at home and in IADLs. Target date: 05/18/22 Goal status: INITIAL   3.  Pt will improve A/ROM in right ring and small finger TAM to at least 190*, to have functional motion for tasks like reach and grasp.  Target date: 05/18/22 Goal status: INITIAL         ASSESSMENT:   CLINICAL IMPRESSION: 04/19/22: Continue to help swelling, pain, motion as a priority until ~ 8 weeks when hand and wrist strength can begin and become a priority   04/09/22: She is still very stiff but slowly improving, admits to not doing HEP as often as asked. Is asked again to be strict about this.   04/02/22: She is doing well for her phase of healing and will likely be doing much better after k-wires are out. We are limited on exercises that can be done until ~6 weeks post-op and depending on healing.      PLAN: OT FREQUENCY: 2x/week (less often at times to allow healing)    OT DURATION: 8 weeks (through 05/18/22)   PLANNED INTERVENTIONS: self care/ADL training, therapeutic exercise, therapeutic activity, neuromuscular re-education, manual therapy, scar mobilization, passive range of motion, splinting, electrical stimulation, ultrasound, fluidotherapy, compression bandaging, moist heat, cryotherapy, contrast bath, patient/family education, coping strategies training, and DME and/or AE instructions   RECOMMENDED OTHER SERVICES: none now    CONSULTED AND AGREED WITH PLAN OF CARE: Patient   PLAN FOR NEXT SESSION:  Consider RMO or dynamic orthosis for stubborn motion. Continue POC    Juri Dinning, OTR/L, CHT 04/19/2022, 11:35 AM

## 2022-04-20 ENCOUNTER — Ambulatory Visit: Payer: Medicare HMO | Admitting: Orthopedic Surgery

## 2022-04-20 ENCOUNTER — Ambulatory Visit (INDEPENDENT_AMBULATORY_CARE_PROVIDER_SITE_OTHER): Payer: Medicare HMO

## 2022-04-20 DIAGNOSIS — S62614A Displaced fracture of proximal phalanx of right ring finger, initial encounter for closed fracture: Secondary | ICD-10-CM

## 2022-04-20 DIAGNOSIS — S62616A Displaced fracture of proximal phalanx of right little finger, initial encounter for closed fracture: Secondary | ICD-10-CM | POA: Diagnosis not present

## 2022-04-20 NOTE — Progress Notes (Signed)
   Post-Op Visit Note   Patient: Alice Scott           Date of Birth: 1951/05/07           MRN: [id number] Visit Date: 04/20/2022 PCP: Elizabeth Palau, FNP   Assessment & Plan:  Chief Complaint:  Chief Complaint  Patient presents with   Right Hand - Follow-up, Fracture   Visit Diagnoses:  1. Closed displaced fracture of proximal phalanx of right ring finger, initial encounter   2. Closed displaced fracture of proximal phalanx of right little finger, initial encounter     Plan: Patient is 6 weeks s/p CRPP of her right ring and small finger proximal phalanx fractures.  She's doing well postoperatively.  She is still quite stiff in these fingers but thinks that she is making progress.  Her pin sites have healed well. She will continue to work hard in therapy and can see me back in another month or so.   Follow-Up Instructions: No follow-ups on file.   Orders:  Orders Placed This Encounter  Procedures   XR Hand Complete Right   No orders of the defined types were placed in this encounter.   Imaging: No results found.  PMFS History: 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   Past Medical History:  Diagnosis Date   Anemia    Arthritis    Diabetes mellitus without complication (HCC)    GERD (gastroesophageal reflux disease)    Gout    Hypothyroidism     No family history on file.  Past Surgical History:  Procedure Laterality Date   ABDOMINAL HYSTERECTOMY     APPENDECTOMY     arthroscopic knee Right    CHOLECYSTECTOMY     CLOSED REDUCTION FINGER WITH PERCUTANEOUS PINNING Right 03/07/2022   Procedure: RIGHT RING AND SMALL FINGER CLOSED REDUCTION  WITH PERCUTANEOUS PINNING;  Surgeon: Marlyne Beards, MD;  Location: [facility];   Service: Orthopedics;  Laterality: Right;   GASTRIC BYPASS  2004   TOTAL KNEE ARTHROPLASTY Right 05/13/2013   Procedure: TOTAL KNEE ARTHROPLASTY;  Surgeon: Nadara Mustard, MD;  Location: [facility];  Service: Orthopedics;  Laterality: Right;  Right Total Knee Arthroplasty   Social History   Occupational History   Not on file  Tobacco Use   Smoking status: Never   Smokeless tobacco: Never  Substance and Sexual Activity   Alcohol use: No   Drug use: No   Sexual activity: Not on file

## 2022-04-26 ENCOUNTER — Encounter: Payer: Medicare HMO | Admitting: Rehabilitative and Restorative Service Providers"

## 2022-04-27 ENCOUNTER — Ambulatory Visit (INDEPENDENT_AMBULATORY_CARE_PROVIDER_SITE_OTHER): Payer: Medicare HMO | Admitting: Rehabilitative and Restorative Service Providers"

## 2022-04-27 ENCOUNTER — Encounter: Payer: Self-pay | Admitting: Rehabilitative and Restorative Service Providers"

## 2022-04-27 DIAGNOSIS — R278 Other lack of coordination: Secondary | ICD-10-CM

## 2022-04-27 DIAGNOSIS — M25641 Stiffness of right hand, not elsewhere classified: Secondary | ICD-10-CM | POA: Diagnosis not present

## 2022-04-27 DIAGNOSIS — M79641 Pain in right hand: Secondary | ICD-10-CM

## 2022-04-27 DIAGNOSIS — M6281 Muscle weakness (generalized): Secondary | ICD-10-CM

## 2022-04-27 DIAGNOSIS — R600 Localized edema: Secondary | ICD-10-CM | POA: Diagnosis not present

## 2022-04-27 NOTE — Therapy (Signed)
OUTPATIENT OCCUPATIONAL THERAPY TREATMENT NOTE   Patient Name: MIRRANDA MONRROY MRN: [id number] DOB:06/13/1951, 71 y.o., female Today's Date: 04/27/2022  PCP: Vicenta Aly, FNP REFERRING PROVIDER: Dr. Sherilyn Cooter   END OF SESSION:   OT End of Session - 04/27/22 0935     Visit Number 7    Number of Visits 12    Date for OT Re-Evaluation 05/18/22    Authorization Type HUMANA    Authorization - Visit Number 12    Progress Note Due on Visit 10    OT Start Time 0935    OT Stop Time 1020    OT Time Calculation (min) 45 min    Activity Tolerance Patient tolerated treatment well;Patient limited by fatigue;Patient limited by pain;No increased pain    Behavior During Therapy WFL for tasks assessed/performed                  Past Medical History:  Diagnosis Date   Anemia    Arthritis    Diabetes mellitus without complication (HCC)    GERD (gastroesophageal reflux disease)    Gout    Hypothyroidism    Past Surgical History:  Procedure Laterality Date   ABDOMINAL HYSTERECTOMY     APPENDECTOMY     arthroscopic knee Right    CHOLECYSTECTOMY     CLOSED REDUCTION FINGER WITH PERCUTANEOUS PINNING Right 03/07/2022   Procedure: RIGHT RING AND SMALL FINGER CLOSED REDUCTION  WITH PERCUTANEOUS PINNING;  Surgeon: Sherilyn Cooter, MD;  Location: [place];  Service: Orthopedics;  Laterality: Right;   GASTRIC BYPASS  2004   TOTAL KNEE ARTHROPLASTY Right 05/13/2013   Procedure: TOTAL KNEE ARTHROPLASTY;  Surgeon: Newt Minion, MD;  Location: [place];  Service: Orthopedics;  Laterality: Right;  Right Total Knee Arthroplasty   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    ONSET DATE: 03/07/22 DOS    REFERRING DIAG:  X10.626R (ICD-10-CM) - Closed  displaced fracture of proximal phalanx of right little finger, initial encounter  S62.614A (ICD-10-CM) - Closed displaced fracture of proximal phalanx of right ring finger, initial encounter    THERAPY DIAG:  Pain in right hand  Stiffness of right hand, not elsewhere classified  Localized edema  Muscle weakness (generalized)  Other lack of coordination  Rationale for Evaluation and Treatment Rehabilitation  PERTINENT HISTORY: Per MD: Right ring and small finger proximal phalangeal fx s/p closed reduction and pinning.  Thermoplast splint.  Edema control and ROM."   PRECAUTIONS:  7 weeks post-op now; light AROM and blocking as tol, full AROM at 4 weeks (pins out) and start light FNL activities, wait until 5 weeks out for PROM as tolerated   WEIGHT BEARING RESTRICTIONS Yes NWB right hand for now   SUBJECTIVE:  She states having some neck stiffness and pain, she feels fingers are stiff but a bit better.    PAIN:  Are you having pain?  Yes   Rating: 2-3/10 at rest now    OBJECTIVE: (All objective assessments below are from initial evaluation on: 03/21/22 unless otherwise specified.)   HAND DOMINANCE: Right    ADLs: Overall ADLs: States decreased ability to grab, hold household objects, pain and inability to pen containers, perform all FMS tasks, etc.      FUNCTIONAL OUTCOME MEASURES: Eval:  Quck DASH 34% impairment today    UPPER EXTREMITY ROM     04/02/22: 0cm gap in opposition to [facility] today    Active ROM Right eval Right 03/26/22 Right 03/30/22 Right 04/09/22 Right 04/27/22  Wrist flexion 64 50 35 45 47  Wrist extension 41 50 45 51 51  (Blank rows = not tested)   Active ROM Right eval Right 03/30/22 Right 04/09/22 Right  04/27/22  Ring MCP (0-90) 0- 26 (-19*)- 51 0- 41 0-49  Ring PIP (0-100) 0- 5 0- 19 (-10) - 37 (-20) - 59  Ring DIP (0-70) 0- 9 0- 9 0- 9 0-24  Little MCP (0-90) 0-20  (-16*) - 43 0- 41 0- 44  Little PIP (0-100) 0- 1 0-0 0- 10 (-2) - 16  Little DIP  (0-70) 0- 6 0- 9 0- 9 0- 7  (Blank rows = not tested)     UPPER EXTREMITY MMT:    Eval: NT due to fx    MMT Right eval  Elbow flexion    Elbow extension    Wrist flexion    Wrist extension    (Blank rows = not tested)   HAND FUNCTION: Eval: Grip strength Right: TBD lbs, Left: TBD lbs    COORDINATION: Eval: Box and Blocks Test: Right TBD Blocks today (53 is [facility]); 9 Hole Peg Test Right: TBD sec (26 sec is WFL)    SENSATION: Eval:  Light touch intact today, though possibly very mildly reduced due to swelling    EDEMA:             04/02/22: 20.4cm circumferentially around MCP Js today Eval:  Mildly swollen in hand and wrist today, 20.1cm circumferentially around MCP Js   COGNITION: Overall cognitive status: WFL for evaluation today    OBSERVATIONS:           03/30/22: Less guarded now but stiffness in wrist has increased. IP Js bending better, but MCP Js not fully extending now.      03/26/22: She has more guarding from pain today, pushes back against OT when OT trying to stretch her, not relaxing hand/wrist/fingers. She is cued/educated against this.   Eval: pins sites look clean and healing, 1 pin is pressing somewhat deeply in dorsum of metacarpal area and may cause some irritation, but right now is fine.      TODAY'S TREATMENT:  04/27/22: OT does manual stretches at fingers and wrist, reviewing importance of AROM blocking vs PROM stretches as she has mild confusion over this today. She then performs back multiple times to ensure understanding. Also edu on a couple neck ROM and stretches due to stiffness there. This makes her feel better. She also does AROM for new measures and does show good improvement at RF and mild improvement at [facility].     04/19/22: Due to increased pain/soreness, OT uses moist heat with review of self-care safety expectations to not be lifting heavy, rest and use ice when sore, wear orthotic periodically to rest painful/sore fingers, etc.  She states using hand  all day now and it's no surprise she is having increased pain and continued swelling. She is cautioned about repetitive activity, especially at now painful wrist (FCU).  OT then does IASTM and manual stretches to help stretch and lengthen FCU in sore wrist, stretches fingers which is not painful to her after MH and MFR. OT also edu on retrograde massage for edema and adds to HEP PRN.  She states feeling much better at  the end. OT also edu for increasing rest as needed (wearing orthotic periodically in day to rest fingers and using pre-fab wrist brace), also adapting activities to avoid wrist ext repetitively. She states understanding.     PATIENT EDUCATION: Education details: See tx section above for details  Person educated: Patient Education method: Verbal Instruction, Teach back, Handouts  Education comprehension: States and demonstrates understanding, Additional Education required      HOME EXERCISE PROGRAM: Access Code: [email or web address] URL: [email or web address] Prepared by: Benito Mccreedy    GOALS: Goals reviewed with patient? Yes     SHORT TERM GOALS: (STG required if POC>30 days)   Pt will obtain protective, custom orthotic. Target date: 03/21/22 Goal status: MET   2.  Pt will demo/state understanding of initial HEP to improve pain levels and prerequisite motion. Target date: 04/06/22 Goal status: INITIAL     LONG TERM GOALS:   Pt will improve functional ability by decreased impairment per Quick DASH assessment from 34% to 10% or better, for better quality of life. Target date: 05/18/22 Goal status: INITIAL   2.  Pt will improve grip strength in dom right hand to at least 40 lbs for functional use at home and in IADLs. Target date: 05/18/22 Goal status: INITIAL   3.  Pt will improve A/ROM in right ring and small finger TAM to at least 190*, to have functional motion for tasks like reach and grasp.  Target date: 05/18/22 Goal status: INITIAL          ASSESSMENT:   CLINICAL IMPRESSION: 04/27/22: Doing well but SF is still very stiff. New hand strength next week should help with that as well. Some confusion about HEP may have been holding her back- this should not be the case now.   04/19/22: Continue to help swelling, pain, motion as a priority until ~ 8 weeks when hand and wrist strength can begin and become a priority   04/09/22: She is still very stiff but slowly improving, admits to not doing HEP as often as asked. Is asked again to be strict about this.      PLAN: OT FREQUENCY: 2x/week (less often at times to allow healing)    OT DURATION: 8 weeks (through 05/18/22)   PLANNED INTERVENTIONS: self care/ADL training, therapeutic exercise, therapeutic activity, neuromuscular re-education, manual therapy, scar mobilization, passive range of motion, splinting, electrical stimulation, ultrasound, fluidotherapy, compression bandaging, moist heat, cryotherapy, contrast bath, patient/family education, coping strategies training, and DME and/or AE instructions   RECOMMENDED OTHER SERVICES: none now    CONSULTED AND AGREED WITH PLAN OF CARE: Patient   PLAN FOR NEXT SESSION:  Start putty hand strength and continue to push motion next week, 8 weeks out. Consider RMO or dynamic orthosis for stubborn motion. Continue POC    Valisha Heslin, OTR/L, CHT 04/27/2022, 12:04 PM

## 2022-05-01 ENCOUNTER — Encounter: Payer: Self-pay | Admitting: Rehabilitative and Restorative Service Providers"

## 2022-05-01 ENCOUNTER — Ambulatory Visit: Payer: Medicare HMO | Admitting: Rehabilitative and Restorative Service Providers"

## 2022-05-01 DIAGNOSIS — R600 Localized edema: Secondary | ICD-10-CM | POA: Diagnosis not present

## 2022-05-01 DIAGNOSIS — M79641 Pain in right hand: Secondary | ICD-10-CM

## 2022-05-01 DIAGNOSIS — M6281 Muscle weakness (generalized): Secondary | ICD-10-CM | POA: Diagnosis not present

## 2022-05-01 DIAGNOSIS — M25641 Stiffness of right hand, not elsewhere classified: Secondary | ICD-10-CM

## 2022-05-01 DIAGNOSIS — R278 Other lack of coordination: Secondary | ICD-10-CM

## 2022-05-01 NOTE — Therapy (Signed)
OUTPATIENT OCCUPATIONAL THERAPY TREATMENT NOTE   Patient Name: Alice Scott MRN: [id number] DOB:[birth year], 71 y.o., female Today's Date: 05/01/2022  PCP: Vicenta Aly, FNP REFERRING PROVIDER: Dr. Sherilyn Cooter   END OF SESSION:   OT End of Session - 05/01/22 0925     Visit Number 8    Number of Visits 12    Date for OT Re-Evaluation 05/18/22    Authorization Type [employer]    Authorization - Visit Number 12    Progress Note Due on Visit 10    OT Start Time [phone]    OT Stop Time 1019    OT Time Calculation (min) 51 min    Equipment Utilized During Treatment orthotic materials    Activity Tolerance Patient tolerated treatment well;Patient limited by fatigue;Patient limited by pain;No increased pain    Behavior During Therapy WFL for tasks assessed/performed                   Past Medical History:  Diagnosis Date   Anemia    Arthritis    Diabetes mellitus without complication (HCC)    GERD (gastroesophageal reflux disease)    Gout    Hypothyroidism    Past Surgical History:  Procedure Laterality Date   ABDOMINAL HYSTERECTOMY     APPENDECTOMY     arthroscopic knee Right    CHOLECYSTECTOMY     CLOSED REDUCTION FINGER WITH PERCUTANEOUS PINNING Right 03/07/2022   Procedure: RIGHT RING AND SMALL FINGER CLOSED REDUCTION  WITH PERCUTANEOUS PINNING;  Surgeon: Sherilyn Cooter, MD;  Location: [place];  Service: Orthopedics;  Laterality: Right;   GASTRIC BYPASS  2004   TOTAL KNEE ARTHROPLASTY Right 05/13/2013   Procedure: TOTAL KNEE ARTHROPLASTY;  Surgeon: Newt Minion, MD;  Location: [place];  Service: Orthopedics;  Laterality: Right;  Right Total Knee Arthroplasty   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    ONSET DATE:  03/07/22 DOS    REFERRING DIAG:  T41.962I (ICD-10-CM) - Closed displaced fracture of proximal phalanx of right little finger, initial encounter  S62.614A (ICD-10-CM) - Closed displaced fracture of proximal phalanx of right ring finger, initial encounter    THERAPY DIAG:  Stiffness of right hand, not elsewhere classified  Localized edema  Pain in right hand  Muscle weakness (generalized)  Other lack of coordination  Rationale for Evaluation and Treatment Rehabilitation  PERTINENT HISTORY: Per MD: Right ring and small finger proximal phalangeal fx s/p closed reduction and pinning.  Thermoplast splint.  Edema control and ROM."   PRECAUTIONS:  8 weeks post-op now; light AROM and blocking as tol, full AROM at 4 weeks (pins out) and start light FNL activities, wait until 5 weeks out for PROM as tolerated   WEIGHT BEARING RESTRICTIONS Yes NWB right hand for now    SUBJECTIVE:  She states neck is feeling better, wrist and fingers slight sore today, mostly feels stiff. Has been stretching (demo's composite finger flexion).    PAIN:  Are you having pain?  Yes   Rating: 1-2/10 at rest now    OBJECTIVE: (All objective assessments below are from initial evaluation on: 03/21/22 unless otherwise specified.)   HAND DOMINANCE: Right    ADLs: Overall ADLs: States decreased ability to grab, hold household objects, pain and inability to  pen containers, perform all FMS tasks, etc.      FUNCTIONAL OUTCOME MEASURES: Eval: [place]uck DASH 34% impairment today    UPPER EXTREMITY ROM     04/02/22: 0cm gap in opposition to [facility] today    Active ROM Right eval Right 04/09/22 Right 04/27/22  Wrist flexion 64 45 47  Wrist extension 41 51 51  (Blank rows = not tested)   Active ROM Right eval Right 03/30/22 Right 04/09/22 Right  04/27/22  Ring MCP (0-90) 0- 26 (-19*)- 51 0- 41 0-49  Ring PIP (0-100) 0- 5 0- 19 (-10) - 37 (-20) - 59  Ring DIP (0-70) 0- 9 0- 9 0- 9 0-24  Little MCP (0-90) 0-20  (-16*) -  43 0- 41 0- 44  Little PIP (0-100) 0- 1 0-0 0- 10 (-2) - 16  Little DIP (0-70) 0- 6 0- 9 0- 9 0- 7  (Blank rows = not tested)     UPPER EXTREMITY MMT:    Eval: NT due to fx    MMT Right TBD PRN  Elbow flexion    Elbow extension    Wrist flexion    Wrist extension    (Blank rows = not tested)   HAND FUNCTION: 05/01/22: Grip strength Right: 11 lbs, Left: 34 lbs    COORDINATION: 05/01/22: Box and Blocks Test: 43 Right  Blocks today (53 is WFL)   SENSATION: Eval:  Light touch intact today, though possibly very mildly reduced due to swelling    EDEMA:             04/02/22: 20.4cm circumferentially around MCP Js today Eval:  Mildly swollen in hand and wrist today, 20.1cm circumferentially around MCP Js   COGNITION: Overall cognitive status: WFL for evaluation today    OBSERVATIONS:           03/30/22: Less guarded now but stiffness in wrist has increased. IP Js bending better, but MCP Js not fully extending now.      03/26/22: She has more guarding from pain today, pushes back against OT when OT trying to stretch her, not relaxing hand/wrist/fingers. She is cued/educated against this.   Eval: pins sites look clean and healing, 1 pin is pressing somewhat deeply in dorsum of metacarpal area and may cause some irritation, but right now is fine.      TODAY'S TREATMENT:  05/01/22:  She starts with grip strength check and then MH with HEP review (4 mins- no skin irritation), followed by OT manual stretches and light joint mobs at fingers, hand. OT also edu how to do dorsal taping 3-4 x day for ~15 min periods.  She is then edu on new hand putty strength activities as below and tolerates well with no sharp pain, but "pulling" and some soreness as expected. She also does FNL activity box and blocks test today. Due to MCP Js moving more than IP Js now, OT also made RMO for relative increased flexion force on SF and RF. Fit well, does intended purpose, edu to wear as much in day as tolerated (or  buddy straps).   Exercises - Full Fist  - 2-3 x daily - 5 reps - Seated Claw Fist with Putty  - 2-3 x daily - 5 reps - "Duck Mouth" Strength  - 2-3 x daily - 5 reps - Finger Extension "Pizza!"   - 2-3 x daily - 5 reps - Thumb Press  - 2-3 x daily - 5 reps - Thumb Opposition with Putty  -  2-3 x daily - 5 reps    04/27/22: OT does manual stretches at fingers and wrist, reviewing importance of AROM blocking vs PROM stretches as she has mild confusion over this today. She then performs back multiple times to ensure understanding. Also edu on a couple neck ROM and stretches due to stiffness there. This makes her feel better. She also does AROM for new measures and does show good improvement at RF and mild improvement at [facility].     04/19/22: Due to increased pain/soreness, OT uses moist heat with review of self-care safety expectations to not be lifting heavy, rest and use ice when sore, wear orthotic periodically to rest painful/sore fingers, etc.  She states using hand all day now and it's no surprise she is having increased pain and continued swelling. She is cautioned about repetitive activity, especially at now painful wrist (FCU).  OT then does IASTM and manual stretches to help stretch and lengthen FCU in sore wrist, stretches fingers which is not painful to her after MH and MFR. OT also edu on retrograde massage for edema and adds to HEP PRN.  She states feeling much better at the end. OT also edu for increasing rest as needed (wearing orthotic periodically in day to rest fingers and using pre-fab wrist brace), also adapting activities to avoid wrist ext repetitively. She states understanding.     PATIENT EDUCATION: Education details: See tx section above for details  Person educated: Patient Education method: Verbal Instruction, Teach back, Handouts  Education comprehension: States and demonstrates understanding, Additional Education required      HOME EXERCISE PROGRAM: Access Code:  [email or web address] URL: [email or web address] Prepared by: Benito Mccreedy    GOALS: Goals reviewed with patient? Yes     SHORT TERM GOALS: (STG required if POC>30 days)   Pt will obtain protective, custom orthotic. Target date: 03/21/22 Goal status: MET   2.  Pt will demo/state understanding of initial HEP to improve pain levels and prerequisite motion. Target date: 04/06/22 Goal status: 05/01/22 Met     LONG TERM GOALS:   Pt will improve functional ability by decreased impairment per Quick DASH assessment from 34% to 10% or better, for better quality of life. Target date: 05/18/22 Goal status: INITIAL   2.  Pt will improve grip strength in dom right hand to at least 40 lbs for functional use at home and in IADLs. Target date: 05/18/22 Goal status: INITIAL   3.  Pt will improve A/ROM in right ring and small finger TAM to at least 190*, to have functional motion for tasks like reach and grasp.  Target date: 05/18/22 Goal status: INITIAL         ASSESSMENT:   CLINICAL IMPRESSION: 05/01/22: She only has 2 visits left per [facility], will need reassessment to determine need for continued therapy.  She has started PRE in hand and can also learn at wrist/arm as tolerated now as well.  Try to d/c all orthotics and return to typical activities by 10-12 weeks post op.  New RMO should help with IP J motion at [facility] and RF. Check next session   04/27/22: Doing well but SF is still very stiff. New hand strength next week should help with that as well. Some confusion about HEP may have been holding her back- this should not be the case now.     PLAN: OT FREQUENCY: 2x/week (less often at times to allow healing)    OT DURATION: 8 weeks (through 05/18/22)   PLANNED INTERVENTIONS:  self care/ADL training, therapeutic exercise, therapeutic activity, neuromuscular re-education, manual therapy, scar mobilization, passive range of motion, splinting, electrical stimulation, ultrasound, fluidotherapy,  compression bandaging, moist heat, cryotherapy, contrast bath, patient/family education, coping strategies training, and DME and/or AE instructions   RECOMMENDED OTHER SERVICES: none now    CONSULTED AND AGREED WITH PLAN OF CARE: Patient   PLAN FOR NEXT SESSION:  Check new RMO, check motion, reassess in net 1-2 visits. Can add wrist PRE if helpful.    Benito Mccreedy, OTR/L, CHT 05/01/2022, 12:32 PM

## 2022-05-02 ENCOUNTER — Encounter: Payer: Self-pay | Admitting: Rehabilitative and Restorative Service Providers"

## 2022-05-02 ENCOUNTER — Ambulatory Visit (INDEPENDENT_AMBULATORY_CARE_PROVIDER_SITE_OTHER): Payer: Medicare HMO | Admitting: Rehabilitative and Restorative Service Providers"

## 2022-05-02 DIAGNOSIS — M79641 Pain in right hand: Secondary | ICD-10-CM

## 2022-05-02 DIAGNOSIS — R600 Localized edema: Secondary | ICD-10-CM

## 2022-05-02 DIAGNOSIS — R278 Other lack of coordination: Secondary | ICD-10-CM | POA: Diagnosis not present

## 2022-05-02 DIAGNOSIS — M25641 Stiffness of right hand, not elsewhere classified: Secondary | ICD-10-CM | POA: Diagnosis not present

## 2022-05-02 DIAGNOSIS — M6281 Muscle weakness (generalized): Secondary | ICD-10-CM

## 2022-05-02 NOTE — Therapy (Signed)
OUTPATIENT OCCUPATIONAL THERAPY TREATMENT NOTE   Patient Name: Alice Scott MRN: [id number] DOB:1951-04-10, 71 y.o., female Today's Date: 05/02/2022  PCP: Vicenta Aly, FNP REFERRING PROVIDER: Dr. Sherilyn Cooter   END OF SESSION:   OT End of Session - 05/02/22 0801     Visit Number 9    Number of Visits 12    Date for OT Re-Evaluation 05/18/22    Authorization Type [employer]    Authorization - Visit Number 12    Progress Note Due on Visit 10    OT Start Time 0802    OT Stop Time 0844    OT Time Calculation (min) 42 min    Equipment Utilized During Treatment orthotic materials    Activity Tolerance Patient tolerated treatment well;Patient limited by fatigue;Patient limited by pain;No increased pain    Behavior During Therapy WFL for tasks assessed/performed                    Past Medical History:  Diagnosis Date   Anemia    Arthritis    Diabetes mellitus without complication (HCC)    GERD (gastroesophageal reflux disease)    Gout    Hypothyroidism    Past Surgical History:  Procedure Laterality Date   ABDOMINAL HYSTERECTOMY     APPENDECTOMY     arthroscopic knee Right    CHOLECYSTECTOMY     CLOSED REDUCTION FINGER WITH PERCUTANEOUS PINNING Right 03/07/2022   Procedure: RIGHT RING AND SMALL FINGER CLOSED REDUCTION  WITH PERCUTANEOUS PINNING;  Surgeon: Sherilyn Cooter, MD;  Location: [place];  Service: Orthopedics;  Laterality: Right;   GASTRIC BYPASS  2004   TOTAL KNEE ARTHROPLASTY Right 05/13/2013   Procedure: TOTAL KNEE ARTHROPLASTY;  Surgeon: Newt Minion, MD;  Location: [place];  Service: Orthopedics;  Laterality: Right;  Right Total Knee Arthroplasty   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    ONSET DATE:  03/07/22 DOS    REFERRING DIAG:  X93.716R (ICD-10-CM) - Closed displaced fracture of proximal phalanx of right little finger, initial encounter  S62.614A (ICD-10-CM) - Closed displaced fracture of proximal phalanx of right ring finger, initial encounter    THERAPY DIAG:  Stiffness of right hand, not elsewhere classified  Localized edema  Pain in right hand  Other lack of coordination  Muscle weakness (generalized)  Rationale for Evaluation and Treatment Rehabilitation  PERTINENT HISTORY: Per MD: Right ring and small finger proximal phalangeal fx s/p closed reduction and pinning.  Thermoplast splint.  Edema control and ROM."   PRECAUTIONS:  8 weeks post-op now; light AROM and blocking as tol, full AROM at 4 weeks (pins out) and start light FNL activities, wait until 5 weeks out for PROM as tolerated   WEIGHT BEARING RESTRICTIONS Yes NWB right hand for now    SUBJECTIVE:  She states RMO was tolerable yesterday.    PAIN:  Are you having pain?  Yes   Rating: 1-2/10 at rest now    OBJECTIVE: (All objective assessments below are from initial evaluation on: 03/21/22 unless otherwise specified.)   HAND DOMINANCE: Right    ADLs: Overall ADLs: States decreased ability to grab, hold household objects, pain and inability to pen containers, perform all FMS tasks, etc.      FUNCTIONAL OUTCOME MEASURES:  EvalShepard General DASH 34% impairment today    UPPER EXTREMITY ROM     04/02/22: 0cm gap in opposition to [facility] today    Active ROM Right eval Right 04/09/22 Right 04/27/22 Right 05/02/22  Wrist flexion 64 45 47 51  Wrist extension 41 51 51 54  (Blank rows = not tested)   Active ROM Right eval Right 03/30/22 Right 04/09/22 Right  04/27/22 Right 05/02/22  Ring MCP (0-90) 0- 26 (-19*)- 51 0- 41 0-49 0- 46  Ring PIP (0-100) 0- 5 0- 19 (-10) - 37 (-20) - 59 0- 58  Ring DIP (0-70) 0- 9 0- 9 0- 9 0-24 0- 22  Little MCP (0-90) 0-20  (-16*) - 43 0- 41 0- 44 0-41  Little PIP (0-100) 0- 1 0-0 0-  10 (-2) - 16 0-14  Little DIP (0-70) 0- 6 0- 9 0- 9 0- 7 0-8  (Blank rows = not tested)     UPPER EXTREMITY MMT:    Eval: NT due to fx    MMT Right TBD PRN  Elbow flexion    Elbow extension    Wrist flexion    Wrist extension    (Blank rows = not tested)   HAND FUNCTION: 05/01/22: Grip strength Right: 11 lbs, Left: 34 lbs    COORDINATION: 05/01/22: Box and Blocks Test: 43 Right  Blocks today (53 is WFL)   SENSATION: Eval:  Light touch intact today, though possibly very mildly reduced due to swelling    EDEMA:             04/02/22: 20.4cm circumferentially around MCP Js today Eval:  Mildly swollen in hand and wrist today, 20.1cm circumferentially around MCP Js   COGNITION: Overall cognitive status: WFL for evaluation today    OBSERVATIONS:           03/30/22: Less guarded now but stiffness in wrist has increased. IP Js bending better, but MCP Js not fully extending now.       TODAY'S TREATMENT:  05/02/22: She does AROM this morning earlier session than normal and is a bit tighter than previous. OT fabricates new dynamic finger flexion orthotic for digits 4, 5 with anchor to [facility]. Fits well, no pain, does job, edu to wear 3x day for 15 mins as tolerated and keep doing rest of HEP. Emphasized extension with max effort and reverse blocking AROM to prevent ext lags. She states understanding.    05/01/22:  She starts with grip strength check and then MH with HEP review (4 mins- no skin irritation), followed by OT manual stretches and light joint mobs at fingers, hand. OT also edu how to do dorsal taping 3-4 x day for ~15 min periods.  She is then edu on new hand putty strength activities as below and tolerates well with no sharp pain, but "pulling" and some soreness as expected. She also does FNL activity box and blocks test today. Due to MCP Js moving more than IP Js now, OT also made RMO for relative increased flexion force on SF and RF. Fit well, does intended purpose, edu to wear as  much in day as tolerated (or buddy straps).   Exercises - Full Fist  - 2-3 x daily - 5 reps - Seated Claw Fist with Putty  - 2-3 x daily - 5 reps - "Duck Mouth" Strength  - 2-3 x daily - 5 reps - Finger Extension "Pizza!"   - 2-3 x daily - 5 reps - Thumb Press  -  2-3 x daily - 5 reps - Thumb Opposition with Putty  - 2-3 x daily - 5 reps  PATIENT EDUCATION: Education details: See tx section above for details  Person educated: Patient Education method: Verbal Instruction, Teach back, Handouts  Education comprehension: States and demonstrates understanding, Additional Education required      HOME EXERCISE PROGRAM: Access Code: [email or web address] URL: [email or web address] Prepared by: Benito Mccreedy    GOALS: Goals reviewed with patient? Yes     SHORT TERM GOALS: (STG required if POC>30 days)   Pt will obtain protective, custom orthotic. Target date: 03/21/22 Goal status: MET   2.  Pt will demo/state understanding of initial HEP to improve pain levels and prerequisite motion. Target date: 04/06/22 Goal status: 05/01/22 Met     LONG TERM GOALS:   Pt will improve functional ability by decreased impairment per Quick DASH assessment from 34% to 10% or better, for better quality of life. Target date: 05/18/22 Goal status: INITIAL   2.  Pt will improve grip strength in dom right hand to at least 40 lbs for functional use at home and in IADLs. Target date: 05/18/22 Goal status: INITIAL   3.  Pt will improve A/ROM in right ring and small finger TAM to at least 190*, to have functional motion for tasks like reach and grasp.  Target date: 05/18/22 Goal status: INITIAL         ASSESSMENT:   CLINICAL IMPRESSION: 05/02/22: She is starting to plateau progress in finger flexion, so OT made new dynamic orthotic. She has all needed orthotics now: dynamic flexion, ext, and RMO.      PLAN: OT FREQUENCY: 2x/week (less often at times to allow healing)    OT DURATION: 8 weeks  (through 05/18/22)   PLANNED INTERVENTIONS: self care/ADL training, therapeutic exercise, therapeutic activity, neuromuscular re-education, manual therapy, scar mobilization, passive range of motion, splinting, electrical stimulation, ultrasound, fluidotherapy, compression bandaging, moist heat, cryotherapy, contrast bath, patient/family education, coping strategies training, and DME and/or AE instructions   RECOMMENDED OTHER SERVICES: none now    CONSULTED AND AGREED WITH PLAN OF CARE: Patient   PLAN FOR NEXT SESSION:  Reassess, heck orthotics as needed and update HEP as appropriate (possibly wrist/arm PRE)    Nathanael Moore, OTR/L, CHT 05/02/2022, 10:15 AM

## 2022-05-07 ENCOUNTER — Encounter: Payer: Self-pay | Admitting: Rehabilitative and Restorative Service Providers"

## 2022-05-07 ENCOUNTER — Ambulatory Visit: Payer: Medicare HMO | Admitting: Rehabilitative and Restorative Service Providers"

## 2022-05-07 DIAGNOSIS — R600 Localized edema: Secondary | ICD-10-CM

## 2022-05-07 DIAGNOSIS — M25641 Stiffness of right hand, not elsewhere classified: Secondary | ICD-10-CM

## 2022-05-07 DIAGNOSIS — M6281 Muscle weakness (generalized): Secondary | ICD-10-CM

## 2022-05-07 DIAGNOSIS — M79641 Pain in right hand: Secondary | ICD-10-CM | POA: Diagnosis not present

## 2022-05-07 DIAGNOSIS — R278 Other lack of coordination: Secondary | ICD-10-CM

## 2022-05-07 NOTE — Therapy (Signed)
OUTPATIENT OCCUPATIONAL THERAPY TREATMENT & PROGRESS NOTE   Patient Name: Alice Scott MRN: [id number] DOB:11-Jul-1951, 71 y.o., female Today's Date: 05/07/2022  PCP: Vicenta Aly, FNP REFERRING PROVIDER: Dr. Sherilyn Cooter   Progress Note Reporting Period 03/21/22 to 05/07/22  See note below for Objective Data and Assessment of Progress/Goals.      END OF SESSION:   OT End of Session - 05/07/22 1032     Visit Number 10    Number of Visits 12    Date for OT Re-Evaluation 05/18/22    Authorization Type HUMANA    Authorization - Visit Number 12    Progress Note Due on Visit 10    OT Start Time 2841    OT Stop Time 1108    OT Time Calculation (min) 39 min    Equipment Utilized During Treatment --    Activity Tolerance Patient tolerated treatment well;Patient limited by fatigue;Patient limited by pain;No increased pain    Behavior During Therapy WFL for tasks assessed/performed                     Past Medical History:  Diagnosis Date   Anemia    Arthritis    Diabetes mellitus without complication (HCC)    GERD (gastroesophageal reflux disease)    Gout    Hypothyroidism    Past Surgical History:  Procedure Laterality Date   ABDOMINAL HYSTERECTOMY     APPENDECTOMY     arthroscopic knee Right    CHOLECYSTECTOMY     CLOSED REDUCTION FINGER WITH PERCUTANEOUS PINNING Right 03/07/2022   Procedure: RIGHT RING AND SMALL FINGER CLOSED REDUCTION  WITH PERCUTANEOUS PINNING;  Surgeon: Sherilyn Cooter, MD;  Location: [place];  Service: Orthopedics;  Laterality: Right;   GASTRIC BYPASS  2004   TOTAL KNEE ARTHROPLASTY Right 05/13/2013   Procedure: TOTAL KNEE ARTHROPLASTY;  Surgeon: Newt Minion, MD;  Location: [place];  Service: Orthopedics;  Laterality: Right;  Right Total Knee Arthroplasty   Patient Active Problem List   Diagnosis Date Noted   Closed displaced fracture of proximal phalanx of right little finger 03/01/2022   Closed displaced  fracture of proximal phalanx of right ring finger 03/01/2022   H/O total knee replacement, 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    ONSET DATE: 03/07/22 DOS    REFERRING DIAG:  L24.401U (ICD-10-CM) - Closed displaced fracture of proximal phalanx of right little finger, initial encounter  S62.614A (ICD-10-CM) - Closed displaced fracture of proximal phalanx of right ring finger, initial encounter    THERAPY DIAG:  Stiffness of right hand, not elsewhere classified  Localized edema  Pain in right hand  Muscle weakness (generalized)  Other lack of coordination  Rationale for Evaluation and Treatment Rehabilitation  PERTINENT HISTORY: Per MD: Right ring and small finger proximal phalangeal fx s/p closed reduction and pinning.  Thermoplast splint.  Edema control and ROM."   PRECAUTIONS:  9 weeks post-op now; light AROM and blocking as tol, full AROM at 4 weeks (pins out) and start light FNL activities, wait until 5 weeks out for PROM as tolerated   WEIGHT BEARING RESTRICTIONS Yes NWB right hand for now    SUBJECTIVE:  She states still having some soreness in dorsal and volar wrist, thinks just from getting more activities and continuing to stretch fingers and wrist.    PAIN:  Are you having pain?  Yes   Rating: 1-2/10 at rest now in wrist near FCU and dorsal extensor retinaculum    OBJECTIVE: (  All objective assessments below are from initial evaluation on: 03/21/22 unless otherwise specified.)   HAND DOMINANCE: Right    ADLs: Overall ADLs: States decreased ability to grab, hold household objects, pain and inability to pen containers, perform all FMS tasks, etc.      FUNCTIONAL OUTCOME MEASURES: Eval: Quck DASH 34% impairment today    UPPER EXTREMITY ROM     04/02/22: 0cm gap in opposition to [facility] today    Active ROM Right eval Right 04/09/22 Right 04/27/22 Right 05/02/22 Right 05/07/22  Wrist flexion 64 45 47 51 52  Wrist extension 41 51 51 54 45  (Blank rows = not tested)   Active ROM  Right eval Right 03/30/22 Right 04/09/22 Right  04/27/22 Right 05/02/22 Right 05/07/22  Ring MCP (0-90) 0- 26 (-19*)- 51 0- 41 0-49 0- 46 0- 44  Ring PIP (0-100) 0- 5 0- 19 (-10) - 37 (-20) - 59 0- 58 (-24)- 50  Ring DIP (0-70) 0- 9 0- 9 0- 9 0-24 0- 22 0- 23  Little MCP (0-90) 0-20  (-16*) - 43 0- 41 0- 44 0-41 0-44  Little PIP (0-100) 0- 1 0-0 0- 10 (-2) - 16 0-14 (-18)- 19 (PROM (-10*) - 32*)  Little DIP (0-70) 0- 6 0- 9 0- 9 0- 7 0-8 (-10)- 9  (Blank rows = not tested)     UPPER EXTREMITY MMT:    05/07/22: has been tolerating hand strengthening for 1-2 weeks but haven't working into wrist strength yet. At least 4-/5 MMT no at wrist in limited ROM.    MMT Right TBD  Elbow flexion    Elbow extension    Wrist flexion    Wrist extension    (Blank rows = not tested)   HAND FUNCTION: 05/01/22: Grip strength Right: 11 lbs, Left: 34 lbs    COORDINATION: 05/01/22: Box and Blocks Test: 43 Right  Blocks today (53 is WFL)   SENSATION: Eval:  Light touch intact today, though possibly very mildly reduced due to swelling    EDEMA:             04/02/22: 20.4cm circumferentially around MCP Js today Eval:  Mildly swollen in hand and wrist today, 20.1cm circumferentially around MCP Js   COGNITION: Overall cognitive status: WFL for evaluation today    OBSERVATIONS:           03/30/22: Less guarded now but stiffness in wrist has increased. IP Js bending better, but MCP Js not fully extending now.       TODAY'S TREATMENT:  05/07/22: She does AROM for new measures and discusses goal status and home activities. She states no issues now for tasks <10# except stiffness and wrist pains at times. Her AROM is a bit worse today in right hand, could be due to d/c night ext orthotic, so she is asked to wear that at night again (new lag present).  Also when asked to demo blocking HEP exercises, she is confused and cannot do them showing that she hasn't been doing them 4-6x day in HEP as asked, and that could  be why she is now not making progress as well.  OT prints out updated HEP and has her performs each. OT also reviews wear of day-time orthotics and she states understanding.   Exercises - Seated Wrist Flexion with Overpressure  - 4-6 x daily - 3-5 reps - 15 sec hold - Wrist Extension Stretch Pronated  - 4-6 x daily - 3-5 reps - 15  hold - Tendon Glides  - 4-6 x daily - 10-15 reps - Hand AROM PIP Blocking  - 4-6 x daily - 1 sets - 10-15 reps - Seated Finger DIP Flexion AROM with Blocking  - 4-6 x daily - 1 sets - 10-15 reps - Hand PROM MCP Flexion  - 4-6 x daily - 3-5 reps - 15 hold - Seated Single Digit Intrinsic Stretch  - 4-6 x daily - 3-5 reps - 15-20 sec hold - Seated Finger Composite Flexion Stretch  - 4-6 x daily - 3-5 reps - 15 hold - Seated Scapular Retraction  - 4-6 x daily - 1 sets - 10-15 reps - Seated Cervical Sidebending AROM  - 4-6 x daily - 1 sets - 10-15 reps - Full Fist  - 2-3 x daily - 5 reps - Seated Claw Fist with Putty  - 2-3 x daily - 5 reps - "Duck Mouth" Strength  - 2-3 x daily - 5 reps - Finger Extension "Pizza!"   - 2-3 x daily - 5 reps - Thumb Press  - 2-3 x daily - 5 reps - Thumb Opposition with Putty  - 2-3 x daily - 5 reps   05/02/22: She does AROM this morning earlier session than normal and is a bit tighter than previous. OT fabricates new dynamic finger flexion orthotic for digits 4, 5 with anchor to [facility]. Fits well, no pain, does job, edu to wear 3x day for 15 mins as tolerated and keep doing rest of HEP. Emphasized extension with max effort and reverse blocking AROM to prevent ext lags. She states understanding.   PATIENT EDUCATION: Education details: See tx section above for details  Person educated: Patient Education method: Verbal Instruction, Teach back, Handouts  Education comprehension: States and demonstrates understanding, Additional Education required      HOME EXERCISE PROGRAM: Access Code: [email or web address] URL:  [email or web address] Prepared by: Benito Mccreedy    GOALS: Goals reviewed with patient? Yes     SHORT TERM GOALS: (STG required if POC>30 days)   Pt will obtain protective, custom orthotic. Target date: 03/21/22 Goal status: MET   2.  Pt will demo/state understanding of initial HEP to improve pain levels and prerequisite motion. Target date: 04/06/22 Goal status: 05/01/22 Met     LONG TERM GOALS:   Pt will improve functional ability by decreased impairment per Quick DASH assessment from 34% to 10% or better, for better quality of life. Target date: 05/18/22 Goal status: 05/07/22: Goal status TBD next session as able (was not done today)    2.  Pt will improve grip strength in dom right hand to at least 40 lbs for functional use at home and in IADLs. Target date: 05/18/22 Goal status: 05/07/22: Improved to 11# non-painful when last tested, needs more work   3.  Pt will improve A/ROM in right ring and small finger TAM to at least 190*, to have functional motion for tasks like reach and grasp.  Target date: 05/18/22 Goal status: 05/07/22: RF improved to 93* TAM now and SF is 44* TAM         ASSESSMENT:   CLINICAL IMPRESSION: 05/07/22: She was steadily improving in first 8 visits, but now is having a hard time progressing. We will continue therapy to keep working toward goals. She needs to be better with doing HEP as she showed signs of not doing HEP completely today. She has all needed bracing, but would benefit from manual stretches, joint mobs, and exercises/activities to get hand  going. Also should start wrist/arm strength whenever tolerated and hand moving better too.    05/02/22: She is starting to plateau progress in finger flexion, so OT made new dynamic orthotic. She has all needed orthotics now: dynamic flexion, ext, and RMO.      PLAN: OT FREQUENCY: 1-2 / week as able (12 additional visits)    OT DURATION: 8 weeks (through 06/22/22)   PLANNED INTERVENTIONS:  self care/ADL training, therapeutic exercise, therapeutic activity, neuromuscular re-education, manual therapy, scar mobilization, passive range of motion, splinting, electrical stimulation, ultrasound, fluidotherapy, compression bandaging, moist heat, cryotherapy, contrast bath, patient/family education, coping strategies training, and DME and/or AE instructions   RECOMMENDED OTHER SERVICES: none now    CONSULTED AND AGREED WITH PLAN OF CARE: Patient   PLAN FOR NEXT SESSION:  She should continue therapy as able for an additional 12 visits and up to 6 more weeks. Continue to track and "push" AROM and PROM. Upgrade to wrist/arm strength when appropriate for her. Record new grip strength and Quick DASH next session, if time as well.     Benito Mccreedy, OTR/L, CHT 05/07/2022, 11:33 AM  Referring diagnosis?  M42.683M (ICD-10-CM) - Closed displaced fracture of proximal phalanx of right little finger, initial encounter  S62.614A (ICD-10-CM) - Closed displaced fracture of proximal phalanx of right ring finger, initial encounter   Treatment diagnosis? (if different than referring diagnosis) M62.81, M79.641, M25.641, R27.8, R60.0  What was this (referring dx) caused by? [x]  Surgery [x]  Fall []  Ongoing issue []  Arthritis []  Other: ____________  Laterality: [x]  Rt []  Lt []  Both  Check all possible CPT codes:  *CHOOSE 10 OR LESS*    [x]  97110 (Therapeutic Exercise)  []  92507 (SLP Treatment)  [x]  97112 (Neuro Re-ed)   []  92526 (Swallowing Treatment)   []  97116 (Gait Training)   []  [id number] (Cognitive Training, 1st 15 minutes) [x]  97140 (Manual Therapy)   []  97130 (Cognitive Training, each add'l 15 minutes)  []  97164 (Re-evaluation)                              []  Other, List CPT Code ____________  [x]  19622 (Therapeutic Activities)     [x]  97535 (Self Care)   []  All codes above (97110 - 97535)  []  97012 (Mechanical Traction)  []  97014 (E-stim Unattended)  []  97032 (E-stim manual)  []   97033 (Ionto)  [x]  97035 (Ultrasound) []  97750 (Physical Performance Training) []  [id number] (Aquatic Therapy) []  97016 (Vasopneumatic Device) []  [id number] (Paraffin) []  97034 (Contrast Bath) []  97597 (Wound Care 1st 20 sq cm) []  97598 (Wound Care each add'l 20 sq cm) []  97760 (Orthotic Fabrication, Fitting, Training Initial) [x]  [id number] (Prosthetic Management and Training Initial) [x]  [id number] (Orthotic or Prosthetic Training/ Modification Subsequent)

## 2022-05-10 ENCOUNTER — Encounter: Payer: Medicare HMO | Admitting: Occupational Therapy

## 2022-05-14 ENCOUNTER — Ambulatory Visit: Payer: Medicare HMO | Attending: Orthopedic Surgery | Admitting: Occupational Therapy

## 2022-05-14 DIAGNOSIS — R278 Other lack of coordination: Secondary | ICD-10-CM | POA: Insufficient documentation

## 2022-05-14 DIAGNOSIS — R600 Localized edema: Secondary | ICD-10-CM | POA: Diagnosis present

## 2022-05-14 DIAGNOSIS — M25641 Stiffness of right hand, not elsewhere classified: Secondary | ICD-10-CM | POA: Diagnosis not present

## 2022-05-14 DIAGNOSIS — M79641 Pain in right hand: Secondary | ICD-10-CM | POA: Diagnosis present

## 2022-05-14 DIAGNOSIS — M6281 Muscle weakness (generalized): Secondary | ICD-10-CM | POA: Insufficient documentation

## 2022-05-14 NOTE — Therapy (Signed)
OUTPATIENT OCCUPATIONAL THERAPY TREATMENT   Patient Name: Alice Scott MRN: [id number] DOB:Jul 23, 1951, 71 y.o., female Today's Date: 05/14/2022  PCP: Vicenta Aly, FNP REFERRING PROVIDER: Dr. Sherilyn Cooter      END OF SESSION:   OT End of Session - 05/14/22 1652     Visit Number 11    Number of Visits 12    Date for OT Re-Evaluation 05/18/22    Authorization Type HUMANA    Authorization - Visit Number 12    Progress Note Due on Visit 10    OT Start Time 1106    OT Stop Time 1151    OT Time Calculation (min) 45 min    Activity Tolerance Patient tolerated treatment well;Patient limited by fatigue;Patient limited by pain;No increased pain    Behavior During Therapy WFL for tasks assessed/performed                      Past Medical History:  Diagnosis Date   Anemia    Arthritis    Diabetes mellitus without complication (HCC)    GERD (gastroesophageal reflux disease)    Gout    Hypothyroidism    Past Surgical History:  Procedure Laterality Date   ABDOMINAL HYSTERECTOMY     APPENDECTOMY     arthroscopic knee Right    CHOLECYSTECTOMY     CLOSED REDUCTION FINGER WITH PERCUTANEOUS PINNING Right 03/07/2022   Procedure: RIGHT RING AND SMALL FINGER CLOSED REDUCTION  WITH PERCUTANEOUS PINNING;  Surgeon: Sherilyn Cooter, MD;  Location: [place];  Service: Orthopedics;  Laterality: Right;   GASTRIC BYPASS  2004   TOTAL KNEE ARTHROPLASTY Right 05/13/2013   Procedure: TOTAL KNEE ARTHROPLASTY;  Surgeon: Newt Minion, MD;  Location: [place];  Service: Orthopedics;  Laterality: Right;  Right Total Knee Arthroplasty   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    ONSET DATE: 03/07/22 DOS    REFERRING DIAG:  S28.315V (ICD-10-CM)  - Closed displaced fracture of proximal phalanx of right little finger, initial encounter  S62.614A (ICD-10-CM) - Closed displaced fracture of proximal phalanx of right ring finger, initial encounter    THERAPY DIAG:  Stiffness of right hand, not elsewhere classified  Localized edema  Pain in right hand  Muscle weakness (generalized)  Other lack of coordination  Rationale for Evaluation and Treatment Rehabilitation  PERTINENT HISTORY: Per MD: Right ring and small finger proximal phalangeal fx s/p closed reduction and pinning.  Thermoplast splint.  Edema control and ROM."   PRECAUTIONS:  9 weeks post-op now; light AROM and blocking as tol, full AROM at 4 weeks (pins out) and start light FNL activities, wait until 5 weeks out for PROM as tolerated   WEIGHT BEARING RESTRICTIONS Yes NWB right hand for now    SUBJECTIVE:  Pt asking if she can use tape provided by primary OT to tape down fingers into prolonged stretch.    PAIN:  Are you having pain?  Yes   Rating: 1-2/10 at rest now in wrist near FCU and dorsal extensor retinaculum    OBJECTIVE: (All objective assessments below are from initial evaluation on: 03/21/22 unless otherwise specified.)   HAND DOMINANCE: Right    ADLs: Overall ADLs: States decreased ability to grab, hold household objects, pain and inability to  pen containers, perform all FMS tasks, etc.      FUNCTIONAL OUTCOME MEASURES: Eval: Quick DASH 34% impairment today  05/14/22: Quick DASH 13.63% impairment   UPPER EXTREMITY ROM     04/02/22: 0cm gap in opposition to [facility] today    Active ROM Right eval Right 04/09/22 Right 04/27/22 Right 05/02/22 Right 05/07/22  Wrist flexion 64 45 47 51 52  Wrist extension 41 51 51 54 45  (Blank rows = not tested)   Active ROM Right eval Right 03/30/22 Right 04/09/22 Right  04/27/22 Right 05/02/22 Right 05/07/22  Ring MCP (0-90) 0- 26 (-19*)- 51 0- 41 0-49 0- 46 0- 44  Ring PIP (0-100) 0- 5 0- 19 (-10) - 37 (-20) - 59 0-  58 (-24)- 50  Ring DIP (0-70) 0- 9 0- 9 0- 9 0-24 0- 22 0- 23  Little MCP (0-90) 0-20  (-16*) - 43 0- 41 0- 44 0-41 0-44  Little PIP (0-100) 0- 1 0-0 0- 10 (-2) - 16 0-14 (-18)- 19 (PROM (-10*) - 32*)  Little DIP (0-70) 0- 6 0- 9 0- 9 0- 7 0-8 (-10)- 9  (Blank rows = not tested)     UPPER EXTREMITY MMT:    05/07/22: has been tolerating hand strengthening for 1-2 weeks but haven't working into wrist strength yet. At least 4-/5 MMT no at wrist in limited ROM.    MMT Right TBD  Elbow flexion    Elbow extension    Wrist flexion    Wrist extension    (Blank rows = not tested)   HAND FUNCTION: 05/01/22: Grip strength Right: 11 lbs, Left: 34 lbs  05/14/22: Grip strength Right: 8 lbs   COORDINATION: 05/01/22: Box and Blocks Test: 43 Right  Blocks today (53 is WFL)   SENSATION: Eval:  Light touch intact today, though possibly very mildly reduced due to swelling    EDEMA:             04/02/22: 20.4cm circumferentially around MCP Js today Eval:  Mildly swollen in hand and wrist today, 20.1cm circumferentially around MCP Js   COGNITION: Overall cognitive status: WFL for evaluation today    OBSERVATIONS:           03/30/22: Less guarded now but stiffness in wrist has increased. IP Js bending better, but MCP Js not fully extending now.       TODAY'S TREATMENT:  05/14/22:  Engaged in assessment of grip strength with pt demonstrating 8# grip with RUE this session.  Pt does report improvements on Quick DASH: 13.63 with improvements to "not at all" with functional tasks such as opening a bottle, but reporting moderate difficulty with carrying heavy items. Pt asking questions about increased dynamic splint wear, therapist reiterating previous wear schedule and encouraged increased engagement in PROM and AROM at PIP and DIP.  Reviewed HEP with focus on PIP and DIP as well as MCP flexion, pt able to return demonstration of each.  Therapist reiterated importance of splint wear as well as engagement in  HEP as previously instructed.   05/07/22: She does AROM for new measures and discusses goal status and home activities. She states no issues now for tasks <10# except stiffness and wrist pains at times. Her AROM is a bit worse today in right hand, could be due to d/c night ext orthotic, so she is asked to wear that at night again (new lag present).  Also when asked to demo blocking HEP exercises, she is confused and  cannot do them showing that she hasn't been doing them 4-6x day in HEP as asked, and that could be why she is now not making progress as well.  OT prints out updated HEP and has her performs each. OT also reviews wear of day-time orthotics and she states understanding.   Exercises - Seated Wrist Flexion with Overpressure  - 4-6 x daily - 3-5 reps - 15 sec hold - Wrist Extension Stretch Pronated  - 4-6 x daily - 3-5 reps - 15 hold - Tendon Glides  - 4-6 x daily - 10-15 reps - Hand AROM PIP Blocking  - 4-6 x daily - 1 sets - 10-15 reps - Seated Finger DIP Flexion AROM with Blocking  - 4-6 x daily - 1 sets - 10-15 reps - Hand PROM MCP Flexion  - 4-6 x daily - 3-5 reps - 15 hold - Seated Single Digit Intrinsic Stretch  - 4-6 x daily - 3-5 reps - 15-20 sec hold - Seated Finger Composite Flexion Stretch  - 4-6 x daily - 3-5 reps - 15 hold - Seated Scapular Retraction  - 4-6 x daily - 1 sets - 10-15 reps - Seated Cervical Sidebending AROM  - 4-6 x daily - 1 sets - 10-15 reps - Full Fist  - 2-3 x daily - 5 reps - Seated Claw Fist with Putty  - 2-3 x daily - 5 reps - "Duck Mouth" Strength  - 2-3 x daily - 5 reps - Finger Extension "Pizza!"   - 2-3 x daily - 5 reps - Thumb Press  - 2-3 x daily - 5 reps - Thumb Opposition with Putty  - 2-3 x daily - 5 reps   05/02/22: She does AROM this morning earlier session than normal and is a bit tighter than previous. OT fabricates new dynamic finger flexion orthotic for digits 4, 5 with anchor to [facility]. Fits well, no pain, does job, edu to wear 3x day for  15 mins as tolerated and keep doing rest of HEP. Emphasized extension with max effort and reverse blocking AROM to prevent ext lags. She states understanding.   PATIENT EDUCATION: Education details: See tx section above for details  Person educated: Patient Education method: Verbal Instruction, Teach back, Handouts  Education comprehension: States and demonstrates understanding, Additional Education required      HOME EXERCISE PROGRAM: Access Code: [email or web address] URL: [email or web address] Prepared by: Benito Mccreedy    GOALS: Goals reviewed with patient? Yes     SHORT TERM GOALS: (STG required if POC>30 days)   Pt will obtain protective, custom orthotic. Target date: 03/21/22 Goal status: MET   2.  Pt will demo/state understanding of initial HEP to improve pain levels and prerequisite motion. Target date: 04/06/22 Goal status: 05/01/22 Met     LONG TERM GOALS:   Pt will improve functional ability by decreased impairment per Quick DASH assessment from 34% to 10% or better, for better quality of life. Target date: 05/18/22 Goal status: 05/07/22: Goal status TBD next session as able (was not done today)    2.  Pt will improve grip strength in dom right hand to at least 40 lbs for functional use at home and in IADLs. Target date: 05/18/22 Goal status: 05/07/22: Improved to 11# non-painful when last tested, needs more work   3.  Pt will improve A/ROM in right ring and small finger TAM to at least 190*, to have functional motion for tasks like reach and grasp.  Target date: 05/18/22 Goal status:  05/07/22: RF improved to 93* TAM now and SF is 44* TAM         ASSESSMENT:   CLINICAL IMPRESSION: 05/14/22:Treatment session with focus on reiterating AROM and PROM to DIP, PIP, and MCP joint as well as splint wear.  Pt asking questions about overnight splint wear and dynamic orthotic.  Pt is demonstrating decreased ROM as well as decreased grip strength in RUE.  Pt will continue  to benefit from completion of HEP as instructed. Pt will also benefit from manual stretches, joint mobs, and exercises/activities to progress hand function.   05/07/22: She was steadily improving in first 8 visits, but now is having a hard time progressing. We will continue therapy to keep working toward goals. She needs to be better with doing HEP as she showed signs of not doing HEP completely today. She has all needed bracing, but would benefit from manual stretches, joint mobs, and exercises/activities to get hand going. Also should start wrist/arm strength whenever tolerated and hand moving better too.    05/02/22: She is starting to plateau progress in finger flexion, so OT made new dynamic orthotic. She has all needed orthotics now: dynamic flexion, ext, and RMO.      PLAN: OT FREQUENCY: 1-2 / week as able (12 additional visits)    OT DURATION: 8 weeks (through 06/22/22)   PLANNED INTERVENTIONS: self care/ADL training, therapeutic exercise, therapeutic activity, neuromuscular re-education, manual therapy, scar mobilization, passive range of motion, splinting, electrical stimulation, ultrasound, fluidotherapy, compression bandaging, moist heat, cryotherapy, contrast bath, patient/family education, coping strategies training, and DME and/or AE instructions   RECOMMENDED OTHER SERVICES: none now    CONSULTED AND AGREED WITH PLAN OF CARE: Patient   PLAN FOR NEXT SESSION:  She should continue therapy as able for an additional 12 visits and up to 6 more weeks. Continue to track and "push" AROM and PROM. Upgrade to wrist/arm strength when appropriate for her.     Simonne Come, OTR/L 05/14/2022, 4:53 PM

## 2022-05-16 ENCOUNTER — Ambulatory Visit: Payer: Medicare HMO | Admitting: Occupational Therapy

## 2022-05-16 DIAGNOSIS — M79641 Pain in right hand: Secondary | ICD-10-CM

## 2022-05-16 DIAGNOSIS — M25641 Stiffness of right hand, not elsewhere classified: Secondary | ICD-10-CM

## 2022-05-16 DIAGNOSIS — R278 Other lack of coordination: Secondary | ICD-10-CM

## 2022-05-16 DIAGNOSIS — M6281 Muscle weakness (generalized): Secondary | ICD-10-CM

## 2022-05-16 NOTE — Therapy (Signed)
OUTPATIENT OCCUPATIONAL THERAPY TREATMENT   Patient Name: Alice Scott MRN: [id number] DOB:Jul 02, 1951, 71 y.o., female Today's Date: 05/17/2022  PCP: Elizabeth Palau, FNP REFERRING PROVIDER: Dr. Marlyne Beards      END OF SESSION:   OT End of Session - 05/17/22 0727     Visit Number 12    Number of Visits 22    Date for OT Re-Evaluation 06/22/22    Authorization Type [employer] - auth submitted at 7/26 visit    Authorization - Visit Number 12    Progress Note Due on Visit 10    OT Start Time 0933    OT Stop Time 1016    OT Time Calculation (min) 43 min    Activity Tolerance Patient tolerated treatment well;Patient limited by fatigue;Patient limited by pain;No increased pain    Behavior During Therapy WFL for tasks assessed/performed                       Past Medical History:  Diagnosis Date   Anemia    Arthritis    Diabetes mellitus without complication (HCC)    GERD (gastroesophageal reflux disease)    Gout    Hypothyroidism    Past Surgical History:  Procedure Laterality Date   ABDOMINAL HYSTERECTOMY     APPENDECTOMY     arthroscopic knee Right    CHOLECYSTECTOMY     CLOSED REDUCTION FINGER WITH PERCUTANEOUS PINNING Right 03/07/2022   Procedure: RIGHT RING AND SMALL FINGER CLOSED REDUCTION  WITH PERCUTANEOUS PINNING;  Surgeon: Marlyne Beards, MD;  Location: [facility];  Service: Orthopedics;  Laterality: Right;   GASTRIC BYPASS  2004   TOTAL KNEE ARTHROPLASTY Right 05/13/2013   Procedure: TOTAL KNEE ARTHROPLASTY;  Surgeon: Nadara Mustard, MD;  Location: [facility];  Service: Orthopedics;  Laterality: Right;  Right Total Knee Arthroplasty   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    ONSET DATE: 03/07/22 DOS     REFERRING DIAG:  X54.008Q (ICD-10-CM) - Closed displaced fracture of proximal phalanx of right little finger, initial encounter  S62.614A (ICD-10-CM) - Closed displaced fracture of proximal phalanx of right ring finger, initial encounter    THERAPY DIAG:  Stiffness of right hand, not elsewhere classified  Pain in right hand  Muscle weakness (generalized)  Other lack of coordination  Rationale for Evaluation and Treatment Rehabilitation  PERTINENT HISTORY: Per MD: Right ring and small finger proximal phalangeal fx s/p closed reduction and pinning.  Thermoplast splint.  Edema control and ROM."   PRECAUTIONS:  9 weeks post-op now; light AROM and blocking as tol, full AROM at 4 weeks (pins out) and start light FNL activities, wait until 5 weeks out for PROM as tolerated   WEIGHT BEARING RESTRICTIONS Yes NWB right hand for now    SUBJECTIVE:  Pt stating "not to make excuses, but I really think my wrist is hampering my progress."   PAIN:  Are you having pain?  Yes   Rating: 2-3/10 at rest now in wrist near FCU and dorsal extensor retinaculum    OBJECTIVE: (All objective assessments below are from initial evaluation on: 03/21/22 unless otherwise specified.)   HAND DOMINANCE: Right    ADLs: Overall ADLs: States decreased ability to grab, hold household objects, pain and inability  to pen containers, perform all FMS tasks, etc.      FUNCTIONAL OUTCOME MEASURES: Eval: Quick DASH 34% impairment today  05/14/22: Quick DASH 13.63% impairment   UPPER EXTREMITY ROM     04/02/22: 0cm gap in opposition to [facility] today    Active ROM Right eval Right 04/09/22 Right 04/27/22 Right 05/02/22 Right 05/07/22  Wrist flexion 64 45 47 51 52  Wrist extension 41 51 51 54 45  (Blank rows = not tested)   Active ROM Right eval Right 03/30/22 Right 04/09/22 Right  04/27/22 Right 05/02/22 Right 05/07/22 Right 05/16/22  Ring MCP (0-90) 0- 26 (-19*)- 51 0- 41 0-49 0- 46 0- 44 46  Ring PIP (0-100) 0- 5 0-  19 (-10) - 37 (-20) - 59 0- 58 (-24)- 50 60  Ring DIP (0-70) 0- 9 0- 9 0- 9 0-24 0- 22 0- 23 24  Little MCP (0-90) 0-20  (-16*) - 43 0- 41 0- 44 0-41 0-44 46  Little PIP (0-100) 0- 1 0-0 0- 10 (-2) - 16 0-14 (-18)- 19 (PROM (-10*) - 32*) 18  Little DIP (0-70) 0- 6 0- 9 0- 9 0- 7 0-8 (-10)- 9 10  (Blank rows = not tested)     UPPER EXTREMITY MMT:    05/07/22: has been tolerating hand strengthening for 1-2 weeks but haven't working into wrist strength yet. At least 4-/5 MMT no at wrist in limited ROM.    MMT Right TBD  Elbow flexion    Elbow extension    Wrist flexion    Wrist extension    (Blank rows = not tested)   HAND FUNCTION: 05/01/22: Grip strength Right: 11 lbs, Left: 34 lbs  05/14/22: Grip strength Right: 8 lbs   COORDINATION: 05/01/22: Box and Blocks Test: 43 Right  Blocks today (53 is WFL)   SENSATION: Eval:  Light touch intact today, though possibly very mildly reduced due to swelling    EDEMA:             04/02/22: 20.4cm circumferentially around MCP Js today Eval:  Mildly swollen in hand and wrist today, 20.1cm circumferentially around MCP Js   COGNITION: Overall cognitive status: WFL for evaluation today    OBSERVATIONS:           03/30/22: Less guarded now but stiffness in wrist has increased. IP Js bending better, but MCP Js not fully extending now.       TODAY'S TREATMENT:  05/16/22: Engaged in exercises to focus on strengthening of grip and 4th and 5th digit flexion. Reviewed active and passive ROM to 4th and 5th digits.  Utilized theraputty while completing full fist, claw fist, and duck mouth strengthening with yellow theraputty.  Pt continues to demonstrate decreased 5th digit flexion at PIP and DIP, encouraged pt to engage in PROM while completing putty exercises for further stretch. Educated on applying tape for further sustained stretch.  Provided pt with handout for taping technique and recommendations to not maintain taping more than 15-20 mins.  Pt  reports understanding of recommendations.   05/14/22:  Engaged in assessment of grip strength with pt demonstrating 8# grip with RUE this session.  Pt does report improvements on Quick DASH: 13.63 with improvements to "not at all" with functional tasks such as opening a bottle, but reporting moderate difficulty with carrying heavy items. Pt asking questions about increased dynamic splint wear, therapist reiterating previous wear schedule and encouraged increased engagement in PROM and AROM at PIP and DIP.  Reviewed HEP with focus on PIP and DIP as well as MCP flexion, pt able to return demonstration of each.  Therapist reiterated importance of splint wear as well as engagement in HEP as previously instructed.   05/07/22: She does AROM for new measures and discusses goal status and home activities. She states no issues now for tasks <10# except stiffness and wrist pains at times. Her AROM is a bit worse today in right hand, could be due to d/c night ext orthotic, so she is asked to wear that at night again (new lag present).  Also when asked to demo blocking HEP exercises, she is confused and cannot do them showing that she hasn't been doing them 4-6x day in HEP as asked, and that could be why she is now not making progress as well.  OT prints out updated HEP and has her performs each. OT also reviews wear of day-time orthotics and she states understanding.   Exercises - Seated Wrist Flexion with Overpressure  - 4-6 x daily - 3-5 reps - 15 sec hold - Wrist Extension Stretch Pronated  - 4-6 x daily - 3-5 reps - 15 hold - Tendon Glides  - 4-6 x daily - 10-15 reps - Hand AROM PIP Blocking  - 4-6 x daily - 1 sets - 10-15 reps - Seated Finger DIP Flexion AROM with Blocking  - 4-6 x daily - 1 sets - 10-15 reps - Hand PROM MCP Flexion  - 4-6 x daily - 3-5 reps - 15 hold - Seated Single Digit Intrinsic Stretch  - 4-6 x daily - 3-5 reps - 15-20 sec hold - Seated Finger Composite Flexion Stretch  - 4-6 x daily -  3-5 reps - 15 hold - Seated Scapular Retraction  - 4-6 x daily - 1 sets - 10-15 reps - Seated Cervical Sidebending AROM  - 4-6 x daily - 1 sets - 10-15 reps - Full Fist  - 2-3 x daily - 5 reps - Seated Claw Fist with Putty  - 2-3 x daily - 5 reps - "Duck Mouth" Strength  - 2-3 x daily - 5 reps - Finger Extension "Pizza!"   - 2-3 x daily - 5 reps - Thumb Press  - 2-3 x daily - 5 reps - Thumb Opposition with Putty  - 2-3 x daily - 5 reps   05/02/22: She does AROM this morning earlier session than normal and is a bit tighter than previous. OT fabricates new dynamic finger flexion orthotic for digits 4, 5 with anchor to [facility]. Fits well, no pain, does job, edu to wear 3x day for 15 mins as tolerated and keep doing rest of HEP. Emphasized extension with max effort and reverse blocking AROM to prevent ext lags. She states understanding.   PATIENT EDUCATION: Education details: See tx section above for details  Person educated: Patient Education method: Verbal Instruction, Teach back, Handouts  Education comprehension: States and demonstrates understanding, Additional Education required      HOME EXERCISE PROGRAM: Access Code: [email or web address]  URL: [email or web address] Prepared by: Benito Mccreedy    GOALS: Goals reviewed with patient? Yes     SHORT TERM GOALS: (STG required if POC>30 days)   Pt will demo/state understanding of advanced HEP to improve pain levels and prerequisite motion. Target date: 06/08/22 Goal status: Initial     LONG TERM GOALS:   Pt will improve functional ability by decreased impairment per Quick DASH assessment from 34% to 10% or better, for better quality of life. Target  date: 05/18/22 Goal status: Progressing as on 05/16/22: pt reports 13.63% impairment   2.  Pt will improve grip strength in dom right hand to at least 40 lbs for functional use at home and in IADLs. Target date: 05/18/22 Goal status: 05/07/22: Improved to 11# non-painful when last tested,  needs more work   3.  Pt will improve A/ROM in right ring and small finger TAM to at least 190*, to have functional motion for tasks like reach and grasp.  Target date: 05/18/22 Goal status: 05/07/22: RF improved to 93* TAM now and SF is 44* TAM   05/16/22: RF improved to 130* TAM now and SF is 74* TAM  LONG TERM GOALS:   Pt will improve functional ability by decreased impairment per Quick DASH assessment from 34% to 10% or better, for better quality of life. Target date: 06/22/22 Goal status: Progressing as on 05/16/22: pt reports 13.63% impairment   2.  Pt will improve grip strength in dom right hand to at least 40 lbs for functional use at home and in IADLs. Target date: 06/22/22 Goal status: 05/07/22: Improved to 11# non-painful when last tested, needs more work   3.  Pt will improve A/ROM in right ring and small finger TAM to at least 190*, to have functional motion for tasks like reach and grasp.  Target date: 9/1//23 Goal status: 05/07/22: RF improved to 93* TAM now and SF is 44* TAM   05/16/22: RF improved to 130* TAM now and SF is 74* TAM         ASSESSMENT:   CLINICAL IMPRESSION: 05/16/22: Treatment session with focus on strengthening and ROM with use of yellow theraputty.  Pt continues to demonstrate decreased TAM in 4th and 5th digits, therefore educated on PROM in conjunction with theraputty.  Therapist also educated on taping in flexion for prolonged stretch in conjunction with continued wear of dynamic splint 3x/day for further stretch.   05/14/22:Treatment session with focus on reiterating AROM and PROM to DIP, PIP, and MCP joint as well as splint wear.  Pt asking questions about overnight splint wear and dynamic orthotic.  Pt is demonstrating decreased ROM as well as decreased grip strength in RUE.  Pt will continue to benefit from completion of HEP as instructed. Pt will also benefit from manual stretches, joint mobs, and exercises/activities to progress hand function.    05/07/22: She was steadily improving in first 8 visits, but now is having a hard time progressing. We will continue therapy to keep working toward goals. She needs to be better with doing HEP as she showed signs of not doing HEP completely today. She has all needed bracing, but would benefit from manual stretches, joint mobs, and exercises/activities to get hand going. Also should start wrist/arm strength whenever tolerated and hand moving better too.    05/02/22: She is starting to plateau progress in finger flexion, so OT made new dynamic orthotic. She has all needed orthotics now: dynamic flexion, ext, and RMO.      PLAN: OT FREQUENCY: 1-2 / week as able (12 additional visits)    OT DURATION: 8 weeks (through 06/22/22)   PLANNED INTERVENTIONS: self care/ADL training, therapeutic exercise, therapeutic activity, neuromuscular re-education, manual therapy, scar mobilization, passive range of motion, splinting, electrical stimulation, ultrasound, fluidotherapy, compression bandaging, moist heat, cryotherapy, contrast bath, patient/family education, coping strategies training, and DME and/or AE instructions   RECOMMENDED OTHER SERVICES: none now    CONSULTED AND AGREED WITH PLAN OF CARE: Patient  PLAN FOR NEXT SESSION:  Continue to track and "push" AROM and PROM. Upgrade to wrist/arm strength when appropriate for her.     Simonne Come, OTR/L 05/17/2022, 7:29 AM

## 2022-05-18 ENCOUNTER — Ambulatory Visit: Payer: Medicare HMO | Admitting: Orthopedic Surgery

## 2022-05-21 ENCOUNTER — Ambulatory Visit: Payer: Medicare HMO | Admitting: Occupational Therapy

## 2022-05-21 DIAGNOSIS — R278 Other lack of coordination: Secondary | ICD-10-CM

## 2022-05-21 DIAGNOSIS — M6281 Muscle weakness (generalized): Secondary | ICD-10-CM

## 2022-05-21 DIAGNOSIS — M25641 Stiffness of right hand, not elsewhere classified: Secondary | ICD-10-CM

## 2022-05-21 DIAGNOSIS — M79641 Pain in right hand: Secondary | ICD-10-CM

## 2022-05-21 NOTE — Therapy (Signed)
OUTPATIENT OCCUPATIONAL THERAPY TREATMENT   Patient Name: Alice Scott MRN: [id number] DOB:05/30/1951, 71 y.o., female Today's Date: 05/21/2022  PCP: Elizabeth Palau, FNP REFERRING PROVIDER: Dr. Marlyne Beards      END OF SESSION:   OT End of Session - 05/21/22 0935     Visit Number 13    Number of Visits 22    Date for OT Re-Evaluation 06/22/22    Authorization Type [employer] - approved through 06/22/22    Authorization - Visit Number 13    Progress Note Due on Visit 20    OT Start Time 0931    OT Stop Time 1015    OT Time Calculation (min) 44 min    Activity Tolerance Patient tolerated treatment well;Patient limited by fatigue;Patient limited by pain;No increased pain    Behavior During Therapy WFL for tasks assessed/performed                        Past Medical History:  Diagnosis Date   Anemia    Arthritis    Diabetes mellitus without complication (HCC)    GERD (gastroesophageal reflux disease)    Gout    Hypothyroidism    Past Surgical History:  Procedure Laterality Date   ABDOMINAL HYSTERECTOMY     APPENDECTOMY     arthroscopic knee Right    CHOLECYSTECTOMY     CLOSED REDUCTION FINGER WITH PERCUTANEOUS PINNING Right 03/07/2022   Procedure: RIGHT RING AND SMALL FINGER CLOSED REDUCTION  WITH PERCUTANEOUS PINNING;  Surgeon: Marlyne Beards, MD;  Location: [facility];  Service: Orthopedics;  Laterality: Right;   GASTRIC BYPASS  2004   TOTAL KNEE ARTHROPLASTY Right 05/13/2013   Procedure: TOTAL KNEE ARTHROPLASTY;  Surgeon: Nadara Mustard, MD;  Location: [facility];  Service: Orthopedics;  Laterality: Right;  Right Total Knee Arthroplasty   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    ONSET DATE: 03/07/22 DOS     REFERRING DIAG:  U23.536R (ICD-10-CM) - Closed displaced fracture of proximal phalanx of right little finger, initial encounter  S62.614A (ICD-10-CM) - Closed displaced fracture of proximal phalanx of right ring finger, initial encounter    THERAPY DIAG:  Stiffness of right hand, not elsewhere classified  Pain in right hand  Muscle weakness (generalized)  Other lack of coordination  Rationale for Evaluation and Treatment Rehabilitation  PERTINENT HISTORY: Per MD: Right ring and small finger proximal phalangeal fx s/p closed reduction and pinning.  Thermoplast splint.  Edema control and ROM."   PRECAUTIONS:  9 weeks post-op now; light AROM and blocking as tol, full AROM at 4 weeks (pins out) and start light FNL activities, wait until 5 weeks out for PROM as tolerated   WEIGHT BEARING RESTRICTIONS Yes NWB right hand for now    SUBJECTIVE:  Pt stating soreness in wrist and stiffness in fingers. "But I think I can tell a difference in my pinky, I hope."   PAIN:  Are you having pain?  Yes   Rating: 2-3/10 at rest now in wrist near FCU and dorsal extensor retinaculum    OBJECTIVE: (All objective assessments below are from initial evaluation on: 03/21/22 unless otherwise specified.)   HAND DOMINANCE: Right    ADLs: Overall ADLs: States decreased ability to grab, hold  household objects, pain and inability to pen containers, perform all FMS tasks, etc.      FUNCTIONAL OUTCOME MEASURES: Eval: Quick DASH 34% impairment today  05/14/22: Quick DASH 13.63% impairment   UPPER EXTREMITY ROM     04/02/22: 0cm gap in opposition to [facility] today    Active ROM Right eval Right 04/09/22 Right 04/27/22 Right 05/02/22 Right 05/07/22  Wrist flexion 64 45 47 51 52  Wrist extension 41 51 51 54 45  (Blank rows = not tested)   Active ROM Right eval Right 03/30/22 Right 04/09/22 Right  04/27/22 Right 05/02/22 Right 05/07/22 Right 05/16/22  Ring MCP (0-90) 0- 26 (-19*)- 51 0- 41 0-49 0- 46 0- 44 46   Ring PIP (0-100) 0- 5 0- 19 (-10) - 37 (-20) - 59 0- 58 (-24)- 50 60  Ring DIP (0-70) 0- 9 0- 9 0- 9 0-24 0- 22 0- 23 24  Little MCP (0-90) 0-20  (-16*) - 43 0- 41 0- 44 0-41 0-44 46  Little PIP (0-100) 0- 1 0-0 0- 10 (-2) - 16 0-14 (-18)- 19 (PROM (-10*) - 32*) 18  Little DIP (0-70) 0- 6 0- 9 0- 9 0- 7 0-8 (-10)- 9 10  (Blank rows = not tested)     UPPER EXTREMITY MMT:    05/07/22: has been tolerating hand strengthening for 1-2 weeks but haven't working into wrist strength yet. At least 4-/5 MMT no at wrist in limited ROM.    MMT Right TBD  Elbow flexion    Elbow extension    Wrist flexion    Wrist extension    (Blank rows = not tested)   HAND FUNCTION: 05/01/22: Grip strength Right: 11 lbs, Left: 34 lbs  05/14/22: Grip strength Right: 8 lbs   COORDINATION: 05/01/22: Box and Blocks Test: 43 Right  Blocks today (53 is WFL)   SENSATION: Eval:  Light touch intact today, though possibly very mildly reduced due to swelling    EDEMA:             04/02/22: 20.4cm circumferentially around MCP Js today Eval:  Mildly swollen in hand and wrist today, 20.1cm circumferentially around MCP Js   COGNITION: Overall cognitive status: WFL for evaluation today    OBSERVATIONS:           03/30/22: Less guarded now but stiffness in wrist has increased. IP Js bending better, but MCP Js not fully extending now.       TODAY'S TREATMENT:  05/21/22: - Engaged in heat for 10 mins to wrist for pain management.  Pt with no adverse signs or symptoms after removal of heat.  Engaged in discussion of typical work movements that impact her pain and limit her.  Pt reports that she had to lift heavier items at work on Friday, causing some pain and discomfort.  Engaged in joint mobilizations at DIP and PIP while engaging in PROM with increased stretch.  Pt reports continued wear of dynamic splint and even engaging in some intermittent taping.  Pt reports mild increase in pain in DIP J with PROM. Therapist  demonstrating AROM and PROM with blocking, pt able to return demonstration.  Discussed improved positioning of forearm to decrease wrist pain as therapist noticing extension at wrist during blocking exercise.   05/16/22: Engaged in exercises to focus on strengthening of grip and 4th and 5th digit flexion. Reviewed active and passive ROM to 4th and 5th digits.  Utilized theraputty while completing full fist, claw fist, and  duck mouth strengthening with yellow theraputty.  Pt continues to demonstrate decreased 5th digit flexion at PIP and DIP, encouraged pt to engage in PROM while completing putty exercises for further stretch. Educated on applying tape for further sustained stretch.  Provided pt with handout for taping technique and recommendations to not maintain taping more than 15-20 mins.  Pt reports understanding of recommendations.   05/14/22:  Engaged in assessment of grip strength with pt demonstrating 8# grip with RUE this session.  Pt does report improvements on Quick DASH: 13.63 with improvements to "not at all" with functional tasks such as opening a bottle, but reporting moderate difficulty with carrying heavy items. Pt asking questions about increased dynamic splint wear, therapist reiterating previous wear schedule and encouraged increased engagement in PROM and AROM at PIP and DIP.  Reviewed HEP with focus on PIP and DIP as well as MCP flexion, pt able to return demonstration of each.  Therapist reiterated importance of splint wear as well as engagement in HEP as previously instructed.   05/07/22: She does AROM for new measures and discusses goal status and home activities. She states no issues now for tasks <10# except stiffness and wrist pains at times. Her AROM is a bit worse today in right hand, could be due to d/c night ext orthotic, so she is asked to wear that at night again (new lag present).  Also when asked to demo blocking HEP exercises, she is confused and cannot do them showing  that she hasn't been doing them 4-6x day in HEP as asked, and that could be why she is now not making progress as well.  OT prints out updated HEP and has her performs each. OT also reviews wear of day-time orthotics and she states understanding.   Exercises - Seated Wrist Flexion with Overpressure  - 4-6 x daily - 3-5 reps - 15 sec hold - Wrist Extension Stretch Pronated  - 4-6 x daily - 3-5 reps - 15 hold - Tendon Glides  - 4-6 x daily - 10-15 reps - Hand AROM PIP Blocking  - 4-6 x daily - 1 sets - 10-15 reps - Seated Finger DIP Flexion AROM with Blocking  - 4-6 x daily - 1 sets - 10-15 reps - Hand PROM MCP Flexion  - 4-6 x daily - 3-5 reps - 15 hold - Seated Single Digit Intrinsic Stretch  - 4-6 x daily - 3-5 reps - 15-20 sec hold - Seated Finger Composite Flexion Stretch  - 4-6 x daily - 3-5 reps - 15 hold - Seated Scapular Retraction  - 4-6 x daily - 1 sets - 10-15 reps - Seated Cervical Sidebending AROM  - 4-6 x daily - 1 sets - 10-15 reps - Full Fist  - 2-3 x daily - 5 reps - Seated Claw Fist with Putty  - 2-3 x daily - 5 reps - "Duck Mouth" Strength  - 2-3 x daily - 5 reps - Finger Extension "Pizza!"   - 2-3 x daily - 5 reps - Thumb Press  - 2-3 x daily - 5 reps - Thumb Opposition with Putty  - 2-3 x daily - 5 reps   05/02/22: She does AROM this morning earlier session than normal and is a bit tighter than previous. OT fabricates new dynamic finger flexion orthotic for digits 4, 5 with anchor to [facility]. Fits well, no pain, does job, edu to wear 3x day for 15 mins as tolerated and keep doing rest of HEP. Emphasized extension  with max effort and reverse blocking AROM to prevent ext lags. She states understanding.   PATIENT EDUCATION: Education details: See tx section above for details  Person educated: Patient Education method: Verbal Instruction, Teach back, Handouts  Education comprehension: States and demonstrates understanding, Additional Education required      HOME EXERCISE  PROGRAM: Access Code: [email or web address]  URL: [email or web address] Prepared by: Benito Mccreedy    GOALS: Goals reviewed with patient? Yes     SHORT TERM GOALS: (STG required if POC>30 days)   Pt will demo/state understanding of advanced HEP to improve pain levels and prerequisite motion. Target date: 06/08/22 Goal status: On-going     LONG TERM GOALS:   Pt will improve functional ability by decreased impairment per Quick DASH assessment from 34% to 10% or better, for better quality of life. Target date: 05/18/22 Goal status: Progressing as on 05/16/22: pt reports 13.63% impairment   2.  Pt will improve grip strength in dom right hand to at least 40 lbs for functional use at home and in IADLs. Target date: 05/18/22 Goal status: 05/07/22: Improved to 11# non-painful when last tested, needs more work   3.  Pt will improve A/ROM in right ring and small finger TAM to at least 190*, to have functional motion for tasks like reach and grasp.  Target date: 05/18/22 Goal status: 05/07/22: RF improved to 93* TAM now and SF is 44* TAM   05/16/22: RF improved to 130* TAM now and SF is 74* TAM  LONG TERM GOALS:   Pt will improve functional ability by decreased impairment per Quick DASH assessment from 34% to 10% or better, for better quality of life. Target date: 06/22/22 Goal status: Progressing as on 05/16/22: pt reports 13.63% impairment   2.  Pt will improve grip strength in dom right hand to at least 40 lbs for functional use at home and in IADLs. Target date: 06/22/22 Goal status: 05/07/22: Improved to 11# non-painful when last tested, needs more work   3.  Pt will improve A/ROM in right ring and small finger TAM to at least 190*, to have functional motion for tasks like reach and grasp.  Target date: 9/1//23 Goal status: 05/07/22: RF improved to 93* TAM now and SF is 44* TAM   05/16/22: RF improved to 130* TAM now and SF is 74* TAM         ASSESSMENT:   CLINICAL  IMPRESSION: 05/21/22: Treatment session with focus on ROM at DIP and PIP of 4th and 5th digits with joint mobilizations and focus on blocking during AROM and PROM.  Therapist utilized moist heat at wrist for pain management and cues for improved UE positioning during stretches to decrease resistance placed at wrist.  Pt will continue to benefit from completion of HEP as instructed. Pt will also benefit from manual stretches, joint mobs, and exercises/activities to progress hand function.    05/16/22: Treatment session with focus on strengthening and ROM with use of yellow theraputty.  Pt continues to demonstrate decreased TAM in 4th and 5th digits, therefore educated on PROM in conjunction with theraputty.  Therapist also educated on taping in flexion for prolonged stretch in conjunction with continued wear of dynamic splint 3x/day for further stretch.     PLAN: OT FREQUENCY: 1-2 / week as able (12 additional visits)    OT DURATION: 8 weeks (through 06/22/22)   PLANNED INTERVENTIONS: self care/ADL training, therapeutic exercise, therapeutic activity, neuromuscular re-education, manual therapy, scar mobilization, passive range of motion, splinting, electrical  stimulation, ultrasound, fluidotherapy, compression bandaging, moist heat, cryotherapy, contrast bath, patient/family education, coping strategies training, and DME and/or AE instructions   RECOMMENDED OTHER SERVICES: none now    CONSULTED AND AGREED WITH PLAN OF CARE: Patient   PLAN FOR NEXT SESSION:  Continue to track and "push" AROM and PROM. Upgrade to wrist/arm strength when appropriate for her.     Simonne Come, OTR/L 05/21/2022, 1:06 PM

## 2022-05-23 ENCOUNTER — Ambulatory Visit: Payer: Medicare HMO | Attending: Orthopedic Surgery | Admitting: Occupational Therapy

## 2022-05-23 DIAGNOSIS — M6281 Muscle weakness (generalized): Secondary | ICD-10-CM | POA: Diagnosis present

## 2022-05-23 DIAGNOSIS — M25641 Stiffness of right hand, not elsewhere classified: Secondary | ICD-10-CM | POA: Diagnosis present

## 2022-05-23 DIAGNOSIS — R278 Other lack of coordination: Secondary | ICD-10-CM | POA: Insufficient documentation

## 2022-05-23 DIAGNOSIS — M79641 Pain in right hand: Secondary | ICD-10-CM | POA: Diagnosis present

## 2022-05-23 NOTE — Therapy (Signed)
OUTPATIENT OCCUPATIONAL THERAPY TREATMENT   Patient Name: Alice Scott MRN: [id number] DOB:[birth year], 71 y.o., female Today's Date: 05/23/2022  PCP: Elizabeth Palau, FNP REFERRING PROVIDER: Dr. Marlyne Beards      END OF SESSION:   OT End of Session - 05/23/22 1108     Visit Number 14    Number of Visits 22    Date for OT Re-Evaluation 06/22/22    Authorization Type HU[employer] - approved through 06/22/22    Authorization - Visit Number 13    Progress Note Due on Visit 20    OT Start Time 1106    OT Stop Time 1148    OT Time Calculation (min) 42 min    Activity Tolerance Patient tolerated treatment well;Patient limited by fatigue;Patient limited by pain;No increased pain    Behavior During Therapy WFL for tasks assessed/performed                        Past Medical History:  Diagnosis Date   Anemia    Arthritis    Diabetes mellitus without complication (HCC)    GERD (gastroesophageal reflux disease)    Gout    Hypothyroidism    Past Surgical History:  Procedure Laterality Date   ABDOMINAL HYSTERECTOMY     APPENDECTOMY     arthroscopic knee Right    CHOLECYSTECTOMY     CLOSED REDUCTION FINGER WITH PERCUTANEOUS PINNING Right 03/07/2022   Procedure: RIGHT RING AND SMALL FINGER CLOSED REDUCTION  WITH PERCUTANEOUS PINNING;  Surgeon: Marlyne Beards, MD;  Location: [facility];  Service: Orthopedics;  Laterality: Right;   GASTRIC BYPASS  2004   TOTAL KNEE ARTHROPLASTY Right 05/13/2013   Procedure: TOTAL KNEE ARTHROPLASTY;  Surgeon: Nadara Mustard, MD;  Location: [facility];  Service: Orthopedics;  Laterality: Right;  Right Total Knee Arthroplasty   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  Thermoplast splint.  Edema control and ROM."   PRECAUTIONS:  11 weeks post-op now; light AROM and blocking as tol, full AROM at 4 weeks (pins out) and start light FNL activities, wait until 5 weeks out for PROM as tolerated   WEIGHT BEARING RESTRICTIONS Yes NWB right hand for now    SUBJECTIVE:  Pt stating "the doctor did say that it may never be perfect again."   PAIN:  Are you having pain?  Yes   Rating: 1-2/10 at rest now in wrist near FCU and dorsal extensor retinaculum    OBJECTIVE: (All objective assessments below are from initial evaluation on: 03/21/22 unless otherwise specified.)   HAND DOMINANCE: Right    ADLs: Overall ADLs: States decreased ability to grab, hold household objects, pain and inability to pen containers, perform  all FMS tasks, etc.      FUNCTIONAL OUTCOME MEASURES: Eval: Quick DASH 34% impairment today  05/14/22: Quick DASH 13.63% impairment   UPPER EXTREMITY ROM     04/02/22: [facility]    Active ROM Right eval Right 04/09/22 Right 04/27/22 Right 05/02/22 Right 05/07/22  Wrist flexion 64 45 47 51 52  Wrist extension 41 51 51 54 45  (Blank rows = not tested)   Active ROM Right eval Right 03/30/22 Right 04/09/22 Right  04/27/22 Right 05/02/22 Right 05/07/22 Right 05/16/22  Ring MCP (0-90) 0- 26 (-19*)- 51 0- 41 0-49 0- 46 0- 44 46  Ring PIP (0-100) 0- 5 0- 19 (-10) - 37 (-20) - 59 0- 58  (-24)- 50 60  Ring DIP (0-70) 0- 9 0- 9 0- 9 0-24 0- 22 0- 23 24  Little MCP (0-90) 0-20  (-16*) - 43 0- 41 0- 44 0-41 0-44 46  Little PIP (0-100) 0- 1 0-0 0- 10 (-2) - 16 0-14 (-18)- 19 (PROM (-10*) - 32*) 18  Little DIP (0-70) 0- 6 0- 9 0- 9 0- 7 0-8 (-10)- 9 10  (Blank rows = not tested)     UPPER EXTREMITY MMT:    05/07/22: has been tolerating hand strengthening for 1-2 weeks but haven't working into wrist strength yet. At least 4-/5 MMT no at wrist in limited ROM.    MMT Right TBD  Elbow flexion    Elbow extension    Wrist flexion    Wrist extension    (Blank rows = not tested)   HAND FUNCTION: 05/01/22: Grip strength Right: 11 lbs, Left: 34 lbs  05/14/22: Grip strength Right: 8 lbs 05/23/22: Grip strength Right: 10 lbs   COORDINATION: 05/01/22: Box and Blocks Test: 43 Right  Blocks today (53 is WFL)   SENSATION: Eval:  Light touch intact today, though possibly very mildly reduced due to swelling    EDEMA:             04/02/22: 20.4cm circumferentially around MCP Js today Eval:  Mildly swollen in hand and wrist today, 20.1cm circumferentially around MCP Js   COGNITION: Overall cognitive status: WFL for evaluation today    OBSERVATIONS:           03/30/22: Less guarded now but stiffness in wrist has increased. IP Js bending better, but MCP Js not fully extending now.       TODAY'S TREATMENT:  05/23/22:  - Engaged in exercises to focus on strengthening of grip and 4th and 5th digit flexion. Utilized yellow theraputty while completing full fist, duck mouth, pizza stretch, and thumb opposition for improved strength and ROM.  Therapist providing intermittent cues for technique, especially with thumb opposition to attempt "O" shape with opposition to facilitate increased flexion. Therapist reiterated importance of splint wear, pt reports wearing dynamic splint 3x/day for 15 mins as well as incorporating occasional taping.  Engaged in DIP and PIP blocking AROM and PROM with min cues  especially with 5th digit.  Engaged in joint mobilizations at DIP and PIP while engaging in PROM with increased stretch.     05/21/22: - Engaged in heat for 10 mins to wrist for pain management.  Pt with no adverse signs or symptoms after removal of heat.  Engaged in discussion of typical work movements that impact her pain and limit her.  Pt reports that she had to lift heavier items at work on Friday, causing some pain and  discomfort.  Engaged in joint mobilizations at DIP and PIP while engaging in PROM with increased stretch.  Pt reports continued wear of dynamic splint and even engaging in some intermittent taping.  Pt reports mild increase in pain in DIP J with PROM. Therapist demonstrating AROM and PROM with blocking, pt able to return demonstration.  Discussed improved positioning of forearm to decrease wrist pain as therapist noticing extension at wrist during blocking exercise.   05/16/22: Engaged in exercises to focus on strengthening of grip and 4th and 5th digit flexion. Reviewed active and passive ROM to 4th and 5th digits.  Utilized theraputty while completing full fist, claw fist, and duck mouth strengthening with yellow theraputty.  Pt continues to demonstrate decreased 5th digit flexion at PIP and DIP, encouraged pt to engage in PROM while completing putty exercises for further stretch. Educated on applying tape for further sustained stretch.  Provided pt with handout for taping technique and recommendations to not maintain taping more than 15-20 mins.  Pt reports understanding of recommendations.   05/14/22:  Engaged in assessment of grip strength with pt demonstrating 8# grip with RUE this session.  Pt does report improvements on Quick DASH: 13.63 with improvements to "not at all" with functional tasks such as opening a bottle, but reporting moderate difficulty with carrying heavy items. Pt asking questions about increased dynamic splint wear, therapist reiterating previous wear schedule  and encouraged increased engagement in PROM and AROM at PIP and DIP.  Reviewed HEP with focus on PIP and DIP as well as MCP flexion, pt able to return demonstration of each.  Therapist reiterated importance of splint wear as well as engagement in HEP as previously instructed.   Exercises - Seated Wrist Flexion with Overpressure  - 4-6 x daily - 3-5 reps - 15 sec hold - Wrist Extension Stretch Pronated  - 4-6 x daily - 3-5 reps - 15 hold - Tendon Glides  - 4-6 x daily - 10-15 reps - Hand AROM PIP Blocking  - 4-6 x daily - 1 sets - 10-15 reps - Seated Finger DIP Flexion AROM with Blocking  - 4-6 x daily - 1 sets - 10-15 reps - Hand PROM MCP Flexion  - 4-6 x daily - 3-5 reps - 15 hold - Seated Single Digit Intrinsic Stretch  - 4-6 x daily - 3-5 reps - 15-20 sec hold - Seated Finger Composite Flexion Stretch  - 4-6 x daily - 3-5 reps - 15 hold - Seated Scapular Retraction  - 4-6 x daily - 1 sets - 10-15 reps - Seated Cervical Sidebending AROM  - 4-6 x daily - 1 sets - 10-15 reps - Full Fist  - 2-3 x daily - 5 reps - Seated Claw Fist with Putty  - 2-3 x daily - 5 reps - "Duck Mouth" Strength  - 2-3 x daily - 5 reps - Finger Extension "Pizza!"   - 2-3 x daily - 5 reps - Thumb Press  - 2-3 x daily - 5 reps - Thumb Opposition with Putty  - 2-3 x daily - 5 reps   PATIENT EDUCATION: Education details: See tx section above for details  Person educated: Patient Education method: Engineer, structural, Teach back, Handouts  Education comprehension: States and demonstrates understanding, Additional Education required      HOME EXERCISE PROGRAM: Access Code: [email or web address]  URL: [email or web address] Prepared by: Fannie Knee    GOALS: Goals reviewed with patient? Yes     SHORT TERM GOALS: (STG required if POC>30 days)  Pt will demo/state understanding of advanced HEP to improve pain levels and prerequisite motion. Target date: 06/08/22 Goal status: On-going     LONG TERM  GOALS:   Pt will improve functional ability by decreased impairment per Quick DASH assessment from 34% to 10% or better, for better quality of life. Target date: 05/18/22 Goal status: Progressing as on 05/16/22: pt reports 13.63% impairment   2.  Pt will improve grip strength in dom right hand to at least 40 lbs for functional use at home and in IADLs. Target date: 05/18/22 Goal status: 05/07/22: Improved to 11# non-painful when last tested, needs more work   3.  Pt will improve A/ROM in right ring and small finger TAM to at least 190*, to have functional motion for tasks like reach and grasp.  Target date: 05/18/22 Goal status: 05/07/22: RF improved to 93* TAM now and SF is 44* TAM   05/16/22: RF improved to 130* TAM now and SF is 74* TAM  LONG TERM GOALS:   Pt will improve functional ability by decreased impairment per Quick DASH assessment from 34% to 10% or better, for better quality of life. Target date: 06/22/22 Goal status: Progressing as on 05/16/22: pt reports 13.63% impairment   2.  Pt will improve grip strength in dom right hand to at least 40 lbs for functional use at home and in IADLs. Target date: 06/22/22 Goal status: 05/07/22: Improved to 11# non-painful when last tested, needs more work   3.  Pt will improve A/ROM in right ring and small finger TAM to at least 190*, to have functional motion for tasks like reach and grasp.  Target date: 9/1//23 Goal status: 05/07/22: RF improved to 93* TAM now and SF is 44* TAM   05/16/22: RF improved to 130* TAM now and SF is 74* TAM         ASSESSMENT:   CLINICAL IMPRESSION: 05/23/22: Treatment session with focus on AROM and PROM at DIP and PIP of 4th and 5th digits with join mobilizations and reinforcement of blocking stretching.  Utilized yellow theraputty for grip and pinch strengthening with focus on increased DIP and PIP flexion during putty exercises this session. Pt continues to demonstrate decreased ROM when attempting various positions  during tendon gliding, due to decreased DIP and PIP flexion in 4th and 5th digit, even reporting pain and stiffness in long finger.     05/21/22: Treatment session with focus on ROM at DIP and PIP of 4th and 5th digits with joint mobilizations and focus on blocking during AROM and PROM.  Therapist utilized moist heat at wrist for pain management and cues for improved UE positioning during stretches to decrease resistance placed at wrist.  Pt will continue to benefit from completion of HEP as instructed. Pt will also benefit from manual stretches, joint mobs, and exercises/activities to progress hand function.    05/16/22: Treatment session with focus on strengthening and ROM with use of yellow theraputty.  Pt continues to demonstrate decreased TAM in 4th and 5th digits, therefore educated on PROM in conjunction with theraputty.  Therapist also educated on taping in flexion for prolonged stretch in conjunction with continued wear of dynamic splint 3x/day for further stretch.     PLAN: OT FREQUENCY: 1-2 / week as able (12 additional visits)    OT DURATION: 8 weeks (through 06/22/22)   PLANNED INTERVENTIONS: self care/ADL training, therapeutic exercise, therapeutic activity, neuromuscular re-education, manual therapy, scar mobilization, passive range of motion, splinting, electrical stimulation, ultrasound,  fluidotherapy, compression bandaging, moist heat, cryotherapy, contrast bath, patient/family education, coping strategies training, and DME and/or AE instructions   RECOMMENDED OTHER SERVICES: none now    CONSULTED AND AGREED WITH PLAN OF CARE: Patient   PLAN FOR NEXT SESSION:  Continue to track and "push" AROM and PROM. Upgrade to wrist/arm strength when appropriate for her.     Rosalio Loud, OTR/L 05/23/2022, 11:50 AM

## 2022-05-30 ENCOUNTER — Ambulatory Visit (INDEPENDENT_AMBULATORY_CARE_PROVIDER_SITE_OTHER): Payer: Medicare HMO | Admitting: Rehabilitative and Restorative Service Providers"

## 2022-05-30 ENCOUNTER — Encounter: Payer: Self-pay | Admitting: Rehabilitative and Restorative Service Providers"

## 2022-05-30 DIAGNOSIS — R600 Localized edema: Secondary | ICD-10-CM

## 2022-05-30 DIAGNOSIS — M79641 Pain in right hand: Secondary | ICD-10-CM

## 2022-05-30 DIAGNOSIS — R278 Other lack of coordination: Secondary | ICD-10-CM

## 2022-05-30 DIAGNOSIS — M6281 Muscle weakness (generalized): Secondary | ICD-10-CM

## 2022-05-30 DIAGNOSIS — M25641 Stiffness of right hand, not elsewhere classified: Secondary | ICD-10-CM

## 2022-05-30 NOTE — Therapy (Signed)
OUTPATIENT OCCUPATIONAL THERAPY TREATMENT   Patient Name: Alice Scott MRN: [id number] DOB:06-13-1951, 71 y.o., female Today's Date: 05/30/2022  PCP: Elizabeth Palau, FNP REFERRING PROVIDER: Dr. Marlyne Beards      END OF SESSION:   OT End of Session - 05/30/22 1507     Visit Number 15    Number of Visits 22    Date for OT Re-Evaluation 06/22/22    Authorization Type HU[employer] - approved through 06/22/22    Authorization - Visit Number 15    Progress Note Due on Visit 20    OT Start Time 1515    OT Stop Time 1604    OT Time Calculation (min) 49 min    Activity Tolerance Patient tolerated treatment well;Patient limited by fatigue;Patient limited by pain;No increased pain    Behavior During Therapy WFL for tasks assessed/performed              Past Medical History:  Diagnosis Date   Anemia    Arthritis    Diabetes mellitus without complication (HCC)    GERD (gastroesophageal reflux disease)    Gout    Hypothyroidism    Past Surgical History:  Procedure Laterality Date   ABDOMINAL HYSTERECTOMY     APPENDECTOMY     arthroscopic knee Right    CHOLECYSTECTOMY     CLOSED REDUCTION FINGER WITH PERCUTANEOUS PINNING Right 03/07/2022   Procedure: RIGHT RING AND SMALL FINGER CLOSED REDUCTION  WITH PERCUTANEOUS PINNING;  Surgeon: Marlyne Beards, MD;  Location: [facility];  Service: Orthopedics;  Laterality: Right;   GASTRIC BYPASS  2004   TOTAL KNEE ARTHROPLASTY Right 05/13/2013   Procedure: TOTAL KNEE ARTHROPLASTY;  Surgeon: Nadara Mustard, MD;  Location: [facility];  Service: Orthopedics;  Laterality: Right;  Right Total Knee Arthroplasty   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    ONSET DATE: 03/07/22 DOS    REFERRING DIAG:  S50.539J  (ICD-10-CM) - Closed displaced fracture of proximal phalanx of right little finger, initial encounter  S62.614A (ICD-10-CM) - Closed displaced fracture of proximal phalanx of right ring finger, initial encounter    THERAPY DIAG:  Stiffness of right hand, not elsewhere classified  Pain in right hand  Muscle weakness (generalized)  Other lack of coordination  Localized edema  Rationale for Evaluation and Treatment Rehabilitation  PERTINENT HISTORY: Per MD: Right ring and small finger proximal phalangeal fx s/p closed reduction and pinning.  Thermoplast splint.  Edema control and ROM."   PRECAUTIONS:  12 weeks post-op now; WBAT now    WEIGHT BEARING RESTRICTIONS Not now   SUBJECTIVE:  Pt states still sore and a bit painful at times, SF still most stiff but RF is making some improvements.   PAIN:  Are you having pain?  Yes    Rating: 1-2/10 at rest now in wrist near FCU and dorsal extensor retinaculum    OBJECTIVE: (All objective assessments below are from initial evaluation on: 03/21/22 unless otherwise specified.)   HAND DOMINANCE: Right    ADLs: Overall ADLs: States decreased ability to grab, hold household objects, pain and inability to pen containers, perform all FMS tasks, etc.      FUNCTIONAL OUTCOME MEASURES: Eval: Quick DASH 34% impairment today  05/14/22: Quick DASH 13.63% impairment   UPPER EXTREMITY  ROM      Active ROM Right eval Right 05/07/22 Right 05/30/22  Wrist flexion 64 52 59  Wrist extension 41 45 55  (Blank rows = not tested)   Active ROM Right eval Right 03/30/22 Right 04/09/22 Right  04/27/22 Right 05/02/22 Right 05/07/22 Right 05/16/22 Right 05/30/22  Ring MCP (0-90) 0- 26 (-19*)- 51 0- 41 0-49 0- 46 0- 44 46 0 -51  Ring PIP (0-100) 0- 5 0- 19 (-10) - 37 (-20) - 59 0- 58 (-24)- 50 60 (-20) - 58  Ring DIP (0-70) 0- 9 0- 9 0- 9 0-24 0- 22 0- 23 24 0- 28  Little MCP (0-90) 0-20  (-16*) - 43 0- 41 0- 44 0-41 0-44 46 0-  53  Little PIP (0-100) 0- 1  0-0 0- 10 (-2) - 16 0-14 (-18)- 19 (PROM (-10*) - 32*) 18 (-15) - 24 PROM (-7) - 46*  Little DIP (0-70) 0- 6 0- 9 0- 9 0- 7 0-8 (-10)- 9 10 0- 10  (Blank rows = not tested)     UPPER EXTREMITY MMT:    05/07/22: has been tolerating hand strengthening for 1-2 weeks but haven't working into wrist strength yet. At least 4-/5 MMT no at wrist in limited ROM.    MMT Right TBD next session  Elbow flexion    Elbow extension    Wrist flexion    Wrist extension    (Blank rows = not tested)   HAND FUNCTION: 05/01/22: Grip strength Right: 11 lbs, Left: 34 lbs  05/14/22: Grip strength Right: 8 lbs 05/23/22: Grip strength Right: 10 lbs    COORDINATION: 05/01/22: Box and Blocks Test: 43 Right  Blocks today (53 is WFL)   SENSATION: Eval:  Light touch intact today, though possibly very mildly reduced due to swelling    EDEMA:             04/02/22: 20.4cm circumferentially around MCP Js today Eval:  Mildly swollen in hand and wrist today, 20.1cm circumferentially around MCP Js   COGNITION: Overall cognitive status: WFL for evaluation today    OBSERVATIONS:           05/30/22: Still stiff but gradually improving. She doesn't shake with right hand today, states fear of someone squeezing her hand, and OT reminds her to not avoid her dominant right hand.   03/30/22: Less guarded now but stiffness in wrist has increased. IP Js bending better, but MCP Js not fully extending now.      TODAY'S TREATMENT:  05/30/22: OT measures wrist and fingers for status check and again, she does AROM with review of HEP and seems to be doing better than before.  OT then does IASTM along volar SF, wrist and FA, also MFR around volar SF, joint mobilizations to wrist and RF, SF before manual strong stretches. She tolerates well. She reviews putty pinching activity to RF and SF and we made it harder with some green putty mixed in now. OT also does k-taping to attempt to improve finger flexion, and she states feeling like it helps  her flex her fingers and gives wrist some support. She is asked to bring in dynamic orthotic next session for any needed adjustments.   05/23/22:  - Engaged in exercises to focus on strengthening of grip and 4th and 5th digit flexion. Utilized yellow theraputty while completing full fist, duck mouth, pizza stretch, and thumb opposition for improved strength and ROM.  Therapist providing intermittent cues for technique, especially  with thumb opposition to attempt "O" shape with opposition to facilitate increased flexion. Therapist reiterated importance of splint wear, pt reports wearing dynamic splint 3x/day for 15 mins as well as incorporating occasional taping.  Engaged in DIP and PIP blocking AROM and PROM with min cues especially with 5th digit.  Engaged in joint mobilizations at DIP and PIP while engaging in PROM with increased stretch.     PATIENT EDUCATION: Education details: See tx section above for details  Person educated: Patient Education method: Verbal Instruction, Teach back, Handouts  Education comprehension: States and demonstrates understanding, Additional Education required      HOME EXERCISE PROGRAM: Access Code: [email or web address]  URL: [email or web address] Prepared by: Fannie Knee    GOALS: Goals reviewed with patient? Yes     SHORT TERM GOALS: (STG required if POC>30 days)   Pt will demo/state understanding of advanced HEP to improve pain levels and prerequisite motion. Target date: 06/08/22 Goal status: On-going     LONG TERM GOALS:   Pt will improve functional ability by decreased impairment per Quick DASH assessment from 34% to 10% or better, for better quality of life. Target date: 05/18/22 Goal status: Progressing as on 05/16/22: pt reports 13.63% impairment   2.  Pt will improve grip strength in dom right hand to at least 40 lbs for functional use at home and in IADLs. Target date: 05/18/22 Goal status: 05/07/22: Improved to 11# non-painful when last  tested, needs more work   3.  Pt will improve A/ROM in right ring and small finger TAM to at least 190*, to have functional motion for tasks like reach and grasp.  Target date: 05/18/22 Goal status: 05/07/22: RF improved to 93* TAM now and SF is 44* TAM   05/16/22: RF improved to 130* TAM now and SF is 74* TAM  LONG TERM GOALS:   Pt will improve functional ability by decreased impairment per Quick DASH assessment from 34% to 10% or better, for better quality of life. Target date: 06/22/22 Goal status: Progressing as on 05/16/22: pt reports 13.63% impairment   2.  Pt will improve grip strength in dom right hand to at least 40 lbs for functional use at home and in IADLs. Target date: 06/22/22 Goal status: 05/07/22: Improved to 11# non-painful when last tested, needs more work   3.  Pt will improve A/ROM in right ring and small finger TAM to at least 190*, to have functional motion for tasks like reach and grasp.  Target date: 9/1//23 Goal status: 05/07/22: RF improved to 93* TAM now and SF is 44* TAM   05/16/22: RF improved to 130* TAM now and SF is 74* TAM       ASSESSMENT:   CLINICAL IMPRESSION: 05/30/22: She has improvement in finger motion and wrist motion when checked today. She also shows some apprehension suing right hand still, so we will continue to push forced use and activities in sessions as well as manual therapy, as it is increasing her PROM at least.   05/23/22: Treatment session with focus on AROM and PROM at DIP and PIP of 4th and 5th digits with join mobilizations and reinforcement of blocking stretching.  Utilized yellow theraputty for grip and pinch strengthening with focus on increased DIP and PIP flexion during putty exercises this session. Pt continues to demonstrate decreased ROM when attempting various positions during tendon gliding, due to decreased DIP and PIP flexion in 4th and 5th digit, even reporting pain and stiffness in long finger.  PLAN: OT FREQUENCY: 1-2 /  week as able (12 additional visits)    OT DURATION: 8 weeks (through 06/22/22)   PLANNED INTERVENTIONS: self care/ADL training, therapeutic exercise, therapeutic activity, neuromuscular re-education, manual therapy, scar mobilization, passive range of motion, splinting, electrical stimulation, ultrasound, fluidotherapy, compression bandaging, moist heat, cryotherapy, contrast bath, patient/family education, coping strategies training, and DME and/or AE instructions   RECOMMENDED OTHER SERVICES: none now    CONSULTED AND AGREED WITH PLAN OF CARE: Patient   PLAN FOR NEXT SESSION:  Check dynamic orthotic, continue manual tx, then attempt some higher-level, resistive activities like push/pull on UBE, also PRE with biceps, triceps to strengthen typically sore wrist and proximal arm to promote distal ability.    Fannie Knee, OTR/L, CHT 05/30/2022, 4:19 PM

## 2022-06-04 ENCOUNTER — Encounter: Payer: Self-pay | Admitting: Rehabilitative and Restorative Service Providers"

## 2022-06-04 ENCOUNTER — Ambulatory Visit: Payer: Medicare HMO | Admitting: Rehabilitative and Restorative Service Providers"

## 2022-06-04 DIAGNOSIS — R600 Localized edema: Secondary | ICD-10-CM | POA: Diagnosis not present

## 2022-06-04 DIAGNOSIS — M25641 Stiffness of right hand, not elsewhere classified: Secondary | ICD-10-CM

## 2022-06-04 DIAGNOSIS — M79641 Pain in right hand: Secondary | ICD-10-CM

## 2022-06-04 DIAGNOSIS — R278 Other lack of coordination: Secondary | ICD-10-CM

## 2022-06-04 DIAGNOSIS — M6281 Muscle weakness (generalized): Secondary | ICD-10-CM | POA: Diagnosis not present

## 2022-06-04 NOTE — Therapy (Signed)
OUTPATIENT OCCUPATIONAL THERAPY TREATMENT   Patient Name: Alice Scott MRN: [id number] DOB:1950/10/30, 71 y.o., female Today's Date: 06/04/2022  PCP: Elizabeth Palau, FNP REFERRING PROVIDER: Dr. Marlyne Beards      END OF SESSION:   OT End of Session - 06/04/22 1526     Visit Number 16    Number of Visits 22    Date for OT Re-Evaluation 06/22/22    Authorization Type [employer] - approved through 06/22/22    Authorization - Visit Number 15    Progress Note Due on Visit 20    OT Start Time 1525    OT Stop Time 1614    OT Time Calculation (min) 49 min    Activity Tolerance Patient tolerated treatment well;Patient limited by fatigue;Patient limited by pain;No increased pain    Behavior During Therapy WFL for tasks assessed/performed               Past Medical History:  Diagnosis Date   Anemia    Arthritis    Diabetes mellitus without complication (HCC)    GERD (gastroesophageal reflux disease)    Gout    Hypothyroidism    Past Surgical History:  Procedure Laterality Date   ABDOMINAL HYSTERECTOMY     APPENDECTOMY     arthroscopic knee Right    CHOLECYSTECTOMY     CLOSED REDUCTION FINGER WITH PERCUTANEOUS PINNING Right 03/07/2022   Procedure: RIGHT RING AND SMALL FINGER CLOSED REDUCTION  WITH PERCUTANEOUS PINNING;  Surgeon: Marlyne Beards, MD;  Location: [facility];  Service: Orthopedics;  Laterality: Right;   GASTRIC BYPASS  2004   TOTAL KNEE ARTHROPLASTY Right 05/13/2013   Procedure: TOTAL KNEE ARTHROPLASTY;  Surgeon: Nadara Mustard, MD;  Location: [facility];  Service: Orthopedics;  Laterality: Right;  Right Total Knee Arthroplasty   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    ONSET DATE: 03/07/22 DOS    REFERRING DIAG:  M01.027O  (ICD-10-CM) - Closed displaced fracture of proximal phalanx of right little finger, initial encounter  S62.614A (ICD-10-CM) - Closed displaced fracture of proximal phalanx of right ring finger, initial encounter    THERAPY DIAG:  No diagnosis found.  Rationale for Evaluation and Treatment Rehabilitation  PERTINENT HISTORY: Per MD: Right ring and small finger proximal phalangeal fx s/p closed reduction and pinning.  Thermoplast splint.  Edema control and ROM."   PRECAUTIONS:  13 weeks post-op now; WBAT now    WEIGHT BEARING RESTRICTIONS Not now   SUBJECTIVE:  Pt states doing well, just a bit sore from HEP and activities, thinks flexion is better  PAIN:  Are you having pain?  Yes    Rating: 1-2/10 at rest now in wrist near FCU and dorsal extensor retinaculum    OBJECTIVE: (All objective assessments below are from initial evaluation on: 03/21/22 unless otherwise specified.)   HAND DOMINANCE: Right    ADLs: Overall ADLs: States decreased ability to grab, hold household objects, pain and inability to pen containers, perform all FMS tasks, etc.      FUNCTIONAL OUTCOME MEASURES: Eval: Quick DASH 34% impairment today  05/14/22: Quick DASH 13.63% impairment   UPPER EXTREMITY ROM      Active ROM Right eval Right 05/07/22 Right 05/30/22  Wrist flexion 64 52 59  Wrist extension 41 45  55  (Blank rows = not tested)   Active ROM Right eval Right 03/30/22 Right 04/09/22 Right  04/27/22 Right 05/02/22 Right 05/07/22 Right 05/16/22 Right 05/30/22  Ring MCP (0-90) 0- 26 (-19*)- 51 0- 41 0-49 0- 46 0- 44 46 0 -51  Ring PIP (0-100) 0- 5 0- 19 (-10) - 37 (-20) - 59 0- 58 (-24)- 50 60 (-20) - 58  Ring DIP (0-70) 0- 9 0- 9 0- 9 0-24 0- 22 0- 23 24 0- 28  Little MCP (0-90) 0-20  (-16*) - 43 0- 41 0- 44 0-41 0-44 46 0-  53  Little PIP (0-100) 0- 1 0-0 0- 10 (-2) - 16 0-14 (-18)- 19 (PROM (-10*) - 32*) 18 (-15) - 24 PROM (-7) - 46*  Little DIP (0-70) 0- 6 0- 9 0- 9 0- 7 0-8 (-10)- 9 10 0- 10   (Blank rows = not tested)     UPPER EXTREMITY MMT:    05/07/22: has been tolerating hand strengthening for 1-2 weeks but haven't working into wrist strength yet. At least 4-/5 MMT no at wrist in limited ROM.    MMT Right TBD next session  Elbow flexion    Elbow extension    Wrist flexion    Wrist extension    (Blank rows = not tested)   HAND FUNCTION: 05/01/22: Grip strength Right: 11 lbs, Left: 34 lbs  05/14/22: Grip strength Right: 8 lbs 05/23/22: Grip strength Right: 10 lbs    COORDINATION: 05/01/22: Box and Blocks Test: 43 Right  Blocks today (53 is WFL)   SENSATION: Eval:  Light touch intact today, though possibly very mildly reduced due to swelling    EDEMA:             04/02/22: 20.4cm circumferentially around MCP Js today Eval:  Mildly swollen in hand and wrist today, 20.1cm circumferentially around MCP Js   COGNITION: Overall cognitive status: WFL for evaluation today    OBSERVATIONS:           05/30/22: Still stiff but gradually improving. She doesn't shake with right hand today, states fear of someone squeezing her hand, and OT reminds her to not avoid her dominant right hand.   03/30/22: Less guarded now but stiffness in wrist has increased. IP Js bending better, but MCP Js not fully extending now.      TODAY'S TREATMENT:  06/04/22: OT starts with her hand on MH for 5 mins while checking her dynamic orthosis.  She shows much better flexion in orthotic, and OT had to adjust it to allow for more finger flexion.  After MH, OT does joint mobs to RF and SF IP Js followed by strong manual stretches in flex and ext at each joint and in composite. OT also has her perform PNF stretches to SF and RF against therapist's resistance.  She then gets into mor dynamic, resistive activities for grip, push/pull etc with the following below. She tolerates these well but with some expected soreness and fatigue, needing periodic rest breaks.   OT measures SF and RF which both make ~ 15-20* TAM  better than last visit (after manual therapy today).   UBE (3.5 resistance, 45+ RPM)  x 5 mins Bicep curls 20 # x 15 Tricep pushes 20 # x 15  Seated press 10# x 10  Seated pull 35# x 15  with slight therapist assist   05/30/22: OT measures wrist and fingers for status check and again, she does AROM with review  of HEP and seems to be doing better than before.  OT then does IASTM along volar SF, wrist and FA, also MFR around volar SF, joint mobilizations to wrist and RF, SF before manual strong stretches. She tolerates well. She reviews putty pinching activity to RF and SF and we made it harder with some green putty mixed in now. OT also does k-taping to attempt to improve finger flexion, and she states feeling like it helps her flex her fingers and gives wrist some support. She is asked to bring in dynamic orthotic next session for any needed adjustments.   05/23/22:  - Engaged in exercises to focus on strengthening of grip and 4th and 5th digit flexion. Utilized yellow theraputty while completing full fist, duck mouth, pizza stretch, and thumb opposition for improved strength and ROM.  Therapist providing intermittent cues for technique, especially with thumb opposition to attempt "O" shape with opposition to facilitate increased flexion. Therapist reiterated importance of splint wear, pt reports wearing dynamic splint 3x/day for 15 mins as well as incorporating occasional taping.  Engaged in DIP and PIP blocking AROM and PROM with min cues especially with 5th digit.  Engaged in joint mobilizations at DIP and PIP while engaging in PROM with increased stretch.     PATIENT EDUCATION: Education details: See tx section above for details  Person educated: Patient Education method: Verbal Instruction, Teach back, Handouts  Education comprehension: States and demonstrates understanding, Additional Education required      HOME EXERCISE PROGRAM: Access Code: [email or web address]  URL:  [email or web address] Prepared by: Fannie Knee    GOALS: Goals reviewed with patient? Yes     SHORT TERM GOALS: (STG required if POC>30 days)   Pt will demo/state understanding of advanced HEP to improve pain levels and prerequisite motion. Target date: 06/08/22 Goal status: On-going     LONG TERM GOALS:   Pt will improve functional ability by decreased impairment per Quick DASH assessment from 34% to 10% or better, for better quality of life. Target date: 05/18/22 Goal status: Progressing as on 05/16/22: pt reports 13.63% impairment   2.  Pt will improve grip strength in dom right hand to at least 40 lbs for functional use at home and in IADLs. Target date: 05/18/22 Goal status: 05/07/22: Improved to 11# non-painful when last tested, needs more work   3.  Pt will improve A/ROM in right ring and small finger TAM to at least 190*, to have functional motion for tasks like reach and grasp.  Target date: 05/18/22 Goal status: 05/07/22: RF improved to 93* TAM now and SF is 44* TAM   05/16/22: RF improved to 130* TAM now and SF is 74* TAM  LONG TERM GOALS:   Pt will improve functional ability by decreased impairment per Quick DASH assessment from 34% to 10% or better, for better quality of life. Target date: 06/22/22 Goal status: Progressing as on 05/16/22: pt reports 13.63% impairment   2.  Pt will improve grip strength in dom right hand to at least 40 lbs for functional use at home and in IADLs. Target date: 06/22/22 Goal status: 05/07/22: Improved to 11# non-painful when last tested, needs more work   3.  Pt will improve A/ROM in right ring and small finger TAM to at least 190*, to have functional motion for tasks like reach and grasp.  Target date: 9/1//23 Goal status: 05/07/22: RF improved to 93* TAM now and SF is 44* TAM   05/16/22: RF improved to 130* TAM now and  SF is 74* TAM       ASSESSMENT:   CLINICAL IMPRESSION: 06/04/22: She continues to improve TAM of  fingers into composite fist. She also is now working on strength and functional conditioning to work toward goals.      PLAN: OT FREQUENCY: 1-2 / week as able (12 additional visits)    OT DURATION: 8 weeks (through 06/22/22)   PLANNED INTERVENTIONS: self care/ADL training, therapeutic exercise, therapeutic activity, neuromuscular re-education, manual therapy, scar mobilization, passive range of motion, splinting, electrical stimulation, ultrasound, fluidotherapy, compression bandaging, moist heat, cryotherapy, contrast bath, patient/family education, coping strategies training, and DME and/or AE instructions   RECOMMENDED OTHER SERVICES: none now    CONSULTED AND AGREED WITH PLAN OF CARE: Patient   PLAN FOR NEXT SESSION:  Continue manual and advanced PRE and activities.    Fannie Knee, OTR/L, CHT 06/04/2022, 4:20 PM

## 2022-06-06 ENCOUNTER — Encounter: Payer: Self-pay | Admitting: Rehabilitative and Restorative Service Providers"

## 2022-06-06 ENCOUNTER — Ambulatory Visit: Payer: Medicare HMO | Admitting: Rehabilitative and Restorative Service Providers"

## 2022-06-06 DIAGNOSIS — M6281 Muscle weakness (generalized): Secondary | ICD-10-CM | POA: Diagnosis not present

## 2022-06-06 DIAGNOSIS — M25641 Stiffness of right hand, not elsewhere classified: Secondary | ICD-10-CM | POA: Diagnosis not present

## 2022-06-06 DIAGNOSIS — M79641 Pain in right hand: Secondary | ICD-10-CM | POA: Diagnosis not present

## 2022-06-06 DIAGNOSIS — R600 Localized edema: Secondary | ICD-10-CM

## 2022-06-06 DIAGNOSIS — R278 Other lack of coordination: Secondary | ICD-10-CM | POA: Diagnosis not present

## 2022-06-06 NOTE — Therapy (Signed)
OUTPATIENT OCCUPATIONAL THERAPY TREATMENT   Patient Name: Alice Scott MRN: [id number] DOB:December 15, 1950, 71 y.o., female Today's Date: 06/06/2022  PCP: Alice Palau, FNP REFERRING PROVIDER: Dr. Marlyne Scott      END OF SESSION:   OT End of Session - 06/06/22 1515     Visit Number 17    Number of Visits 22    Date for OT Re-Evaluation 06/22/22    Authorization Type [employer] - approved through 06/22/22    Authorization - Visit Number 17    Progress Note Due on Visit 20    OT Start Time 1515    OT Stop Time 1610    OT Time Calculation (min) 55 min    Equipment Utilized During Treatment red t-band    Activity Tolerance Patient tolerated treatment well;Patient limited by fatigue;Patient limited by pain;No increased pain    Behavior During Therapy WFL for tasks assessed/performed                Past Medical History:  Diagnosis Date   Anemia    Arthritis    Diabetes mellitus without complication (HCC)    GERD (gastroesophageal reflux disease)    Gout    Hypothyroidism    Past Surgical History:  Procedure Laterality Date   ABDOMINAL HYSTERECTOMY     APPENDECTOMY     arthroscopic knee Right    CHOLECYSTECTOMY     CLOSED REDUCTION FINGER WITH PERCUTANEOUS PINNING Right 03/07/2022   Procedure: RIGHT RING AND SMALL FINGER CLOSED REDUCTION  WITH PERCUTANEOUS PINNING;  Surgeon: Alice Beards, MD;  Location: [facility];  Service: Orthopedics;  Laterality: Right;   GASTRIC BYPASS  2004   TOTAL KNEE ARTHROPLASTY Right 05/13/2013   Procedure: TOTAL KNEE ARTHROPLASTY;  Surgeon: Alice Mustard, MD;  Location: [facility];  Service: Orthopedics;  Laterality: Right;  Right Total Knee Arthroplasty   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     ONSET DATE: 03/07/22 DOS    REFERRING DIAG:  K24.097D (ICD-10-CM) - Closed displaced fracture of proximal phalanx of right little finger, initial encounter  S62.614A (ICD-10-CM) - Closed displaced fracture of proximal phalanx of right ring finger, initial encounter    THERAPY DIAG:  Stiffness of right hand, not elsewhere classified  Muscle weakness (generalized)  Other lack of coordination  Pain in right hand  Localized edema  Rationale for Evaluation and Treatment Rehabilitation  PERTINENT HISTORY: Per MD: Right ring and small finger proximal phalangeal fx s/p closed reduction and pinning.  Thermoplast splint.  Edema control and ROM."   PRECAUTIONS:  13 weeks post-op now; WBAT now    WEIGHT BEARING RESTRICTIONS Not now   SUBJECTIVE:  Pt states not being overly sore from last session's advanced PRE.   PAIN:  Are you having pain?  No  Rating: 0/10 at rest now in wrist near FCU and dorsal extensor retinaculum    OBJECTIVE: (All objective assessments below are from initial evaluation on: 03/21/22 unless otherwise specified.)   HAND DOMINANCE: Right    ADLs: Overall ADLs: States decreased ability to grab, hold household objects, pain and inability to pen containers, perform all FMS tasks, etc.      FUNCTIONAL OUTCOME MEASURES: Eval: Quick DASH 34% impairment today  05/14/22: Quick DASH 13.63% impairment   UPPER EXTREMITY  ROM      Active ROM Right eval Right 05/07/22 Right 05/30/22  Wrist flexion 64 52 59  Wrist extension 41 45 55  (Blank rows = not tested)   Active ROM Right eval Right 03/30/22 Right 04/09/22 Right  04/27/22 Right 05/02/22 Right 05/07/22 Right 05/16/22 Right 05/30/22  Ring MCP (0-90) 0- 26 (-19*)- 51 0- 41 0-49 0- 46 0- 44 46 0 -51  Ring PIP (0-100) 0- 5 0- 19 (-10) - 37 (-20) - 59 0- 58 (-24)- 50 60 (-20) - 58  Ring DIP (0-70) 0- 9 0- 9 0- 9 0-24 0- 22 0- 23 24 0- 28  Little MCP (0-90) 0-20  (-16*) - 43 0- 41 0- 44 0-41 0-44 46 0-  53  Little  PIP (0-100) 0- 1 0-0 0- 10 (-2) - 16 0-14 (-18)- 19 (PROM (-10*) - 32*) 18 (-15) - 24 PROM (-7) - 46*  Little DIP (0-70) 0- 6 0- 9 0- 9 0- 7 0-8 (-10)- 9 10 0- 10  (Blank rows = not tested)     UPPER EXTREMITY MMT:    05/07/22: has been tolerating hand strengthening for 1-2 weeks but haven't working into wrist strength yet. At least 4-/5 MMT no at wrist in limited ROM.    MMT Right 06/06/22  Elbow flexion 4+/5   Elbow extension 4+/5   Wrist flexion 4/5   Wrist extension  4+/5  (Blank rows = not tested)   HAND FUNCTION: 05/01/22: Grip strength Right: 11 lbs, Left: 34 lbs  05/14/22: Grip strength Right: 8 lbs 05/23/22: Grip strength Right: 10 lbs 06/06/22: Grip 17.6#    COORDINATION: 05/01/22: Box and Blocks Test: 43 Right  Blocks today (53 is WFL)   SENSATION: Eval:  Light touch intact today, though possibly very mildly reduced due to swelling    EDEMA:             04/02/22: 20.4cm circumferentially around MCP Js today Eval:  Mildly swollen in hand and wrist today, 20.1cm circumferentially around MCP Js   COGNITION: Overall cognitive status: WFL for evaluation today    OBSERVATIONS:           05/30/22: Still stiff but gradually improving. She doesn't shake with right hand today, states fear of someone squeezing her hand, and OT reminds her to not avoid her dominant right hand.   03/30/22: Less guarded now but stiffness in wrist has increased. IP Js bending better, but MCP Js not fully extending now.      TODAY'S TREATMENT:  06/06/22: OT continues with joint mobs to RF and SF IP Js followed by strong manual stretches in flex and ext at each joint and in composite. OT reviews PNF stretches and performs with her against therapist's resistance.  She then continues dynamic, resistive activities for grip, push/pull etc with the following below. Again, she tolerates these well but with some expected soreness and fatigue, showing improvements in tolerance.   UBE (4.0 resistance, 45+ RPM)  x 5  mins (hand coban wrapped to facilitate SF stretch)   Bicep curls 20 # x 15 Tricep pushes 20 # x 15  Seated press 10# x 10  Seated pull 35# x 15   12# Farmer's carry  activity x128ft Green flexbar twist activity x15   OT also reviews HEP stretches at fingers and wrist with her with expectation she does 3-4 x day at home and also gave out red t-band with edu to perform wrist flex and ext strength  at home 1-3 x day 10-15 reps, and she demo's in session well, no pain.   Grip strength significantly improved after tx today.    PATIENT EDUCATION: Education details: See tx section above for details  Person educated: Patient Education method: Verbal Instruction, Teach back, Handouts  Education comprehension: States and demonstrates understanding, Additional Education required      HOME EXERCISE PROGRAM: Access Code: [email or web address]  URL: [email or web address] Prepared by: Fannie Knee    GOALS: Goals reviewed with patient? Yes     SHORT TERM GOALS: (STG required if POC>30 days)   Pt will demo/state understanding of advanced HEP to improve pain levels and prerequisite motion. Target date: 06/08/22 Goal status: On-going     LONG TERM GOALS:   Pt will improve functional ability by decreased impairment per Quick DASH assessment from 34% to 10% or better, for better quality of life. Target date: 05/18/22 Goal status: Progressing as on 05/16/22: pt reports 13.63% impairment   2.  Pt will improve grip strength in dom right hand to at least 40 lbs for functional use at home and in IADLs. Target date: 05/18/22 Goal status: 05/07/22: Improved to 11# non-painful when last tested, needs more work   3.  Pt will improve A/ROM in right ring and small finger TAM to at least 190*, to have functional motion for tasks like reach and grasp.  Target date: 05/18/22 Goal status: 05/07/22: RF improved to 93* TAM now and SF is 44* TAM   05/16/22: RF improved to 130* TAM now and SF is 74* TAM  LONG  TERM GOALS:   Pt will improve functional ability by decreased impairment per Quick DASH assessment from 34% to 10% or better, for better quality of life. Target date: 06/22/22 Goal status: Progressing as on 05/16/22: pt reports 13.63% impairment   2.  Pt will improve grip strength in dom right hand to at least 40 lbs for functional use at home and in IADLs. Target date: 06/22/22 Goal status: 05/07/22: Improved to 11# non-painful when last tested, needs more work   3.  Pt will improve A/ROM in right ring and small finger TAM to at least 190*, to have functional motion for tasks like reach and grasp.  Target date: 9/1//23 Goal status: 05/07/22: RF improved to 93* TAM now and SF is 44* TAM   05/16/22: RF improved to 130* TAM now and SF is 74* TAM       ASSESSMENT:   CLINICAL IMPRESSION: 06/06/22: Continue on working toward goal in last planned 2 weeks of therapy as needed. She continues to show improvements from treatment and HEP routines.    06/04/22: She continues to improve TAM of fingers into composite fist. She also is now working on strength and functional conditioning to work toward goals.     PLAN: OT FREQUENCY: 1-2 / week as able (12 additional visits)    OT DURATION: 8 weeks (through 06/22/22)   PLANNED INTERVENTIONS: self care/ADL training, therapeutic exercise, therapeutic activity, neuromuscular re-education, manual therapy, scar mobilization, passive range of motion, splinting, electrical stimulation, ultrasound, fluidotherapy, compression bandaging, moist heat, cryotherapy, contrast bath, patient/family education, coping strategies training, and DME and/or AE instructions   RECOMMENDED OTHER SERVICES: none now    CONSULTED AND AGREED WITH PLAN OF CARE: Patient   PLAN FOR NEXT SESSION:  Continue manual treatments, activities and exercises as tolerated     Fannie Knee, OTR/L, CHT 06/06/2022, 4:19 PM

## 2022-06-11 ENCOUNTER — Ambulatory Visit: Payer: Medicare HMO | Admitting: Rehabilitative and Restorative Service Providers"

## 2022-06-11 ENCOUNTER — Encounter: Payer: Self-pay | Admitting: Rehabilitative and Restorative Service Providers"

## 2022-06-11 DIAGNOSIS — R278 Other lack of coordination: Secondary | ICD-10-CM | POA: Diagnosis not present

## 2022-06-11 DIAGNOSIS — M6281 Muscle weakness (generalized): Secondary | ICD-10-CM

## 2022-06-11 DIAGNOSIS — M25641 Stiffness of right hand, not elsewhere classified: Secondary | ICD-10-CM | POA: Diagnosis not present

## 2022-06-11 DIAGNOSIS — M79641 Pain in right hand: Secondary | ICD-10-CM | POA: Diagnosis not present

## 2022-06-11 DIAGNOSIS — R600 Localized edema: Secondary | ICD-10-CM

## 2022-06-11 NOTE — Therapy (Signed)
OUTPATIENT OCCUPATIONAL THERAPY TREATMENT   Patient Name: Alice Scott MRN: [id number] DOB:1951/02/10, 71 y.o., female Today's Date: 06/11/2022  PCP: Elizabeth Palau, FNP REFERRING PROVIDER: Dr. Marlyne Beards      END OF SESSION:   OT End of Session - 06/11/22 1517     Visit Number 18    Number of Visits 22    Date for OT Re-Evaluation 06/22/22    Authorization Type [employer] - approved through 06/22/22    Authorization - Visit Number 18    Progress Note Due on Visit 20    OT Start Time 1517    Equipment Utilized During Treatment red t-band    Activity Tolerance Patient tolerated treatment well;Patient limited by fatigue;Patient limited by pain;No increased pain    Behavior During Therapy WFL for tasks assessed/performed                 Past Medical History:  Diagnosis Date   Anemia    Arthritis    Diabetes mellitus without complication (HCC)    GERD (gastroesophageal reflux disease)    Gout    Hypothyroidism    Past Surgical History:  Procedure Laterality Date   ABDOMINAL HYSTERECTOMY     APPENDECTOMY     arthroscopic knee Right    CHOLECYSTECTOMY     CLOSED REDUCTION FINGER WITH PERCUTANEOUS PINNING Right 03/07/2022   Procedure: RIGHT RING AND SMALL FINGER CLOSED REDUCTION  WITH PERCUTANEOUS PINNING;  Surgeon: Marlyne Beards, MD;  Location: [facility];  Service: Orthopedics;  Laterality: Right;   GASTRIC BYPASS  2004   TOTAL KNEE ARTHROPLASTY Right 05/13/2013   Procedure: TOTAL KNEE ARTHROPLASTY;  Surgeon: Nadara Mustard, MD;  Location: [facility];  Service: Orthopedics;  Laterality: Right;  Right Total Knee Arthroplasty   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    ONSET DATE: 03/07/22 DOS    REFERRING DIAG:  J88.416S  (ICD-10-CM) - Closed displaced fracture of proximal phalanx of right little finger, initial encounter  S62.614A (ICD-10-CM) - Closed displaced fracture of proximal phalanx of right ring finger, initial encounter    THERAPY DIAG:  Stiffness of right hand, not elsewhere classified  Muscle weakness (generalized)  Other lack of coordination  Pain in right hand  Localized edema  Rationale for Evaluation and Treatment Rehabilitation  PERTINENT HISTORY: Per MD: Right ring and small finger proximal phalangeal fx s/p closed reduction and pinning.  Thermoplast splint.  Edema control and ROM."   PRECAUTIONS:  14 weeks post-op now; WBAT now    WEIGHT BEARING RESTRICTIONS Not now   SUBJECTIVE:  Pt states doing well at home and work, no significant issues and is challenged by OT to do all normal daily activities and "not hold back" now.   PAIN:  Are you having pain?  No   Rating: 0/10 at rest now in wrist near FCU and dorsal extensor retinaculum    OBJECTIVE: (All objective assessments below are from initial evaluation on: 03/21/22 unless otherwise specified.)   HAND DOMINANCE: Right    ADLs: Overall ADLs: States decreased ability to grab, hold household objects, pain and inability to pen containers, perform all FMS tasks, etc.      FUNCTIONAL OUTCOME MEASURES: Eval: Quick DASH 34% impairment today  05/14/22: Quick DASH 13.63% impairment  UPPER EXTREMITY ROM      Active ROM Right eval Right 05/07/22 Right 05/30/22  Wrist flexion 64 52 59  Wrist extension 41 45 55  (Blank rows = not tested)   Active ROM Right eval Right 03/30/22 Right 04/09/22 Right  04/27/22 Right 05/02/22 Right 05/07/22 Right 05/16/22 Right 05/30/22 Right 06/11/22  Ring MCP (0-90) 0- 26 (-19*)- 51 0- 41 0-49 0- 46 0- 44 46 0 -51 0 - 47  Ring PIP (0-100) 0- 5 0- 19 (-10) - 37 (-20) - 59 0- 58 (-24)- 50 60 (-20) - 58 (-18) - 62  Ring DIP (0-70) 0- 9 0- 9 0- 9 0-24 0- 22 0- 23 24 0- 28 0 - 28  Little MCP (0-90)  0-20  (-16*) - 43 0- 41 0- 44 0-41 0-44 46 0-  53 0 - 51  Little PIP (0-100) 0- 1 0-0 0- 10 (-2) - 16 0-14 (-18)- 19 (PROM (-10*) - 32*) 18 (-15) - 24 PROM (-7) - 46* (-15) - 33  Little DIP (0-70) 0- 6 0- 9 0- 9 0- 7 0-8 (-10)- 9 10 0- 10 0 - 14  (Blank rows = not tested)     UPPER EXTREMITY MMT:    05/07/22: has been tolerating hand strengthening for 1-2 weeks but haven't working into wrist strength yet. At least 4-/5 MMT no at wrist in limited ROM.    MMT Right 06/06/22  Elbow flexion 4+/5   Elbow extension 4+/5   Wrist flexion 4/5   Wrist extension  4+/5  (Blank rows = not tested)   HAND FUNCTION: 05/01/22: Grip strength Right: 11 lbs, Left: 34 lbs  05/14/22: Grip strength Right: 8 lbs 05/23/22: Grip strength Right: 10 lbs 06/06/22: Grip 17.6#    COORDINATION: 05/01/22: Box and Blocks Test: 43 Right  Blocks today (53 is WFL)   SENSATION: Eval:  Light touch intact today, though possibly very mildly reduced due to swelling    EDEMA:             04/02/22: 20.4cm circumferentially around MCP Js today Eval:  Mildly swollen in hand and wrist today, 20.1cm circumferentially around MCP Js   COGNITION: Overall cognitive status: WFL for evaluation today    OBSERVATIONS:           05/30/22: Still stiff but gradually improving. She doesn't shake with right hand today, states fear of someone squeezing her hand, and OT reminds her to not avoid her dominant right hand.   03/30/22: Less guarded now but stiffness in wrist has increased. IP Js bending better, but MCP Js not fully extending now.      TODAY'S TREATMENT:  06/11/22: She does AROM for new measures and SF is ~178 improved now TAM, but RF did not make significant improvements over the weekend. OT reviews EP, highlighting MCP J stretches and PNF stretches again. She then does advanced/whole arm PRE and activities as below, and she does well, has some fatigue.   UBE (4.0 resistance, 45+ RPM)  x 5 mins (hand coban wrapped to facilitate SF  stretch)   Bicep curls 20 # 2 x 15 Tricep pushes 20 # x 15  Seated press 10# x 10  Seated pull 35# x 15   12# Farmer's carry  activity x186ft  After those, OT does manual therapy: MFR to dorsum of hand and SF, gr 2 joint mobs to SF joints, then strong composite flexion stretches to F and RF.  She tolerates well, no c/o  soreness at end.   PATIENT EDUCATION: Education details: See tx section above for details  Person educated: Patient Education method: Verbal Instruction, Teach back, Handouts  Education comprehension: States and demonstrates understanding, Additional Education required      HOME EXERCISE PROGRAM: Access Code: [email or web address]  URL: [email or web address] Prepared by: Fannie Knee    GOALS: Goals reviewed with patient? Yes     SHORT TERM GOALS: (STG required if POC>30 days)   Pt will demo/state understanding of advanced HEP to improve pain levels and prerequisite motion. Target date: 06/08/22 Goal status: On-going     LONG TERM GOALS:   Pt will improve functional ability by decreased impairment per Quick DASH assessment from 34% to 10% or better, for better quality of life. Target date: 05/18/22 Goal status: Progressing as on 05/16/22: pt reports 13.63% impairment   2.  Pt will improve grip strength in dom right hand to at least 40 lbs for functional use at home and in IADLs. Target date: 05/18/22 Goal status: 05/07/22: Improved to 11# non-painful when last tested, needs more work   3.  Pt will improve A/ROM in right ring and small finger TAM to at least 190*, to have functional motion for tasks like reach and grasp.  Target date: 05/18/22 Goal status: 05/07/22: RF improved to 93* TAM now and SF is 44* TAM   05/16/22: RF improved to 130* TAM now and SF is 74* TAM  LONG TERM GOALS:   Pt will improve functional ability by decreased impairment per Quick DASH assessment from 34% to 10% or better, for better quality of life. Target date: 06/22/22 Goal status:  Progressing as on 05/16/22: pt reports 13.63% impairment   2.  Pt will improve grip strength in dom right hand to at least 40 lbs for functional use at home and in IADLs. Target date: 06/22/22 Goal status: 05/07/22: Improved to 11# non-painful when last tested, needs more work   3.  Pt will improve A/ROM in right ring and small finger TAM to at least 190*, to have functional motion for tasks like reach and grasp.  Target date: 06/22/22 Goal status: 05/07/22: RF improved to 93* TAM now and SF is 44* TAM   05/16/22: RF improved to 130* TAM now and SF is 74* TAM       ASSESSMENT:   CLINICAL IMPRESSION: 06/11/22: SF motion continues to improve slightly. OT is very clear that she must be doing strong stretches 3-4 x day and using her hand as much as possible to make progress at this point. She states understanding and will continue to do HEP. She is due for reassessment by the end of next week and will likely have maximized her progress at that point.      PLAN: OT FREQUENCY: 1-2 / week as able (12 additional visits)    OT DURATION: 8 weeks (through 06/22/22)   PLANNED INTERVENTIONS: self care/ADL training, therapeutic exercise, therapeutic activity, neuromuscular re-education, manual therapy, scar mobilization, passive range of motion, splinting, electrical stimulation, ultrasound, fluidotherapy, compression bandaging, moist heat, cryotherapy, contrast bath, patient/family education, coping strategies training, and DME and/or AE instructions   RECOMMENDED OTHER SERVICES: none now    CONSULTED AND AGREED WITH PLAN OF CARE: Patient   PLAN FOR NEXT SESSION:  Continue manual treatments, activities and exercises as tolerated     Fannie Knee, OTR/L, CHT 06/11/2022, 4:07 PM

## 2022-06-13 ENCOUNTER — Encounter: Payer: Medicare HMO | Admitting: Rehabilitative and Restorative Service Providers"

## 2022-06-18 ENCOUNTER — Encounter: Payer: Self-pay | Admitting: Rehabilitative and Restorative Service Providers"

## 2022-06-18 ENCOUNTER — Ambulatory Visit: Payer: Medicare HMO | Admitting: Rehabilitative and Restorative Service Providers"

## 2022-06-18 ENCOUNTER — Encounter: Payer: Medicare HMO | Admitting: Rehabilitative and Restorative Service Providers"

## 2022-06-18 DIAGNOSIS — R278 Other lack of coordination: Secondary | ICD-10-CM | POA: Diagnosis not present

## 2022-06-18 DIAGNOSIS — M6281 Muscle weakness (generalized): Secondary | ICD-10-CM | POA: Diagnosis not present

## 2022-06-18 DIAGNOSIS — M79641 Pain in right hand: Secondary | ICD-10-CM

## 2022-06-18 DIAGNOSIS — M25641 Stiffness of right hand, not elsewhere classified: Secondary | ICD-10-CM

## 2022-06-18 DIAGNOSIS — R600 Localized edema: Secondary | ICD-10-CM

## 2022-06-18 NOTE — Therapy (Signed)
OUTPATIENT OCCUPATIONAL THERAPY TREATMENT   Patient Name: Alice Scott MRN: [id number] DOB:1951/08/29, 71 y.o., female Today's Date: 06/18/2022  PCP: Vicenta Aly, FNP REFERRING PROVIDER: Dr. Sherilyn Cooter      END OF SESSION:   OT End of Session - 06/18/22 0803     Visit Number 19    Number of Visits 22    Date for OT Re-Evaluation 06/22/22    Authorization Type [employer] - approved through 06/22/22    Authorization - Visit Number 19    Progress Note Due on Visit 20    OT Start Time 0804    OT Stop Time 0848    OT Time Calculation (min) 44 min    Activity Tolerance Patient tolerated treatment well;Patient limited by fatigue;Patient limited by pain;No increased pain    Behavior During Therapy WFL for tasks assessed/performed             Past Medical History:  Diagnosis Date   Anemia    Arthritis    Diabetes mellitus without complication (HCC)    GERD (gastroesophageal reflux disease)    Gout    Hypothyroidism    Past Surgical History:  Procedure Laterality Date   ABDOMINAL HYSTERECTOMY     APPENDECTOMY     arthroscopic knee Right    CHOLECYSTECTOMY     CLOSED REDUCTION FINGER WITH PERCUTANEOUS PINNING Right 03/07/2022   Procedure: RIGHT RING AND SMALL FINGER CLOSED REDUCTION  WITH PERCUTANEOUS PINNING;  Surgeon: Sherilyn Cooter, MD;  Location: [facility];  Service: Orthopedics;  Laterality: Right;   GASTRIC BYPASS  2004   TOTAL KNEE ARTHROPLASTY Right 05/13/2013   Procedure: TOTAL KNEE ARTHROPLASTY;  Surgeon: Newt Minion, MD;  Location: [facility];  Service: Orthopedics;  Laterality: Right;  Right Total Knee Arthroplasty   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    ONSET DATE: 03/07/22 DOS    REFERRING DIAG:  Q82.500B  (ICD-10-CM) - Closed displaced fracture of proximal phalanx of right little finger, initial encounter  S62.614A (ICD-10-CM) - Closed displaced fracture of proximal phalanx of right ring finger, initial encounter    THERAPY DIAG:  Stiffness of right hand, not elsewhere classified  Muscle weakness (generalized)  Other lack of coordination  Pain in right hand  Localized edema  Rationale for Evaluation and Treatment Rehabilitation  PERTINENT HISTORY: Per MD: Right ring and small finger proximal phalangeal fx s/p closed reduction and pinning.  Thermoplast splint.  Edema control and ROM."   PRECAUTIONS:  15 weeks post-op now; WBAT now    WEIGHT BEARING RESTRICTIONS Not now   SUBJECTIVE:  Pt states doing well, no significant pain/soreness today, though she was sore in arthritic neck after last appointment. Has been pushing herself and attempting all I/ADLs, doesn't bring up specific issues today.  PAIN:  Are you having pain?  No   Rating: 0/10 at rest now   OBJECTIVE: (All objective assessments below are from initial evaluation on: 03/21/22 unless otherwise specified.)   HAND DOMINANCE: Right    ADLs: Overall ADLs: States decreased ability to grab, hold household objects, pain and inability to pen containers, perform all FMS tasks, etc.      FUNCTIONAL OUTCOME MEASURES: Eval: Quick DASH 34% impairment today  05/14/22: Quick DASH 13.63% impairment   UPPER EXTREMITY  ROM      Active ROM Right eval Right 05/07/22 Right 05/30/22  Wrist flexion 64 52 59  Wrist extension 41 45 55  (Blank rows = not tested)   Active ROM Right eval Right 03/30/22 Right 04/09/22 Right  04/27/22 Right 05/02/22 Right 05/07/22 Right 05/16/22 Right 05/30/22 Right 06/11/22  Ring MCP (0-90) 0- 26 (-19*)- 51 0- 41 0-49 0- 46 0- 44 46 0 -51 0 - 47  Ring PIP (0-100) 0- 5 0- 19 (-10) - 37 (-20) - 59 0- 58 (-24)- 50 60 (-20) - 58 (-18) - 62  Ring DIP (0-70) 0- 9 0- 9 0- 9 0-24 0- 22 0- 23 24 0- 28 0 - 28   Little MCP (0-90) 0-20  (-16*) - 43 0- 41 0- 44 0-41 0-44 46 0-  53 0 - 51  Little PIP (0-100) 0- 1 0-0 0- 10 (-2) - 16 0-14 (-18)- 19 (PROM (-10*) - 32*) 18 (-15) - 24 PROM (-7) - 46* (-15) - 33  Little DIP (0-70) 0- 6 0- 9 0- 9 0- 7 0-8 (-10)- 9 10 0- 10 0 - 14  (Blank rows = not tested)     UPPER EXTREMITY MMT:    05/07/22: has been tolerating hand strengthening for 1-2 weeks but haven't working into wrist strength yet. At least 4-/5 MMT no at wrist in limited ROM.    MMT Right 06/06/22  Elbow flexion 4+/5   Elbow extension 4+/5   Wrist flexion 4/5   Wrist extension  4+/5  (Blank rows = not tested)   HAND FUNCTION: 05/01/22: Grip strength Right: 11 lbs, Left: 34 lbs  05/14/22: Grip strength Right: 8 lbs 05/23/22: Grip strength Right: 10 lbs 06/06/22: Grip 17.6#    COORDINATION: 05/01/22: Box and Blocks Test: 43 Right  Blocks today (53 is WFL)   SENSATION: Eval:  Light touch intact today, though possibly very mildly reduced due to swelling    EDEMA:             04/02/22: 20.4cm circumferentially around MCP Js today Eval:  Mildly swollen in hand and wrist today, 20.1cm circumferentially around MCP Js   COGNITION: Overall cognitive status: WFL for evaluation today    OBSERVATIONS:           05/30/22: Still stiff but gradually improving. She doesn't shake with right hand today, states fear of someone squeezing her hand, and OT reminds her to not avoid her dominant right hand.   03/30/22: Less guarded now but stiffness in wrist has increased. IP Js bending better, but MCP Js not fully extending now.      TODAY'S TREATMENT:  06/18/22: OT starts with manual MFR, IASTM around SF and RF, then manual stretches at MCP Js and IP Js also PNF stretches- she states mildly painful.  She does more advanced PRE and has some wrist pain with pushes today, so weight is dropped and focus more on pain-free reps and intermittent wrist stretches.  She is again encouraged to do all home tasks for upcoming  reassessment.    Prayer stretches: 3x  Bicep curls 15 #  x20 Tricep pushes 15 # x 20  Seated press 5# x 10  Seated pull 30# x 20   12# Farmer's carry activity x164f   06/11/22: She does AROM for new measures and SF is ~178 improved now TAM, but RF did not make significant improvements over the weekend. OT reviews EP, highlighting MCP J stretches and PNF stretches again.  She then does advanced/whole arm PRE and activities as below, and she does well, has some fatigue.   UBE (4.0 resistance, 45+ RPM)  x 5 mins (hand coban wrapped to facilitate SF stretch)   Bicep curls 20 # 2 x 15 Tricep pushes 20 # x 15  Seated press 10# x 10  Seated pull 35# x 15   12# Farmer's carry  activity x167f  After those, OT does manual therapy: MFR to dorsum of hand and SF, gr 2 joint mobs to SF joints, then strong composite flexion stretches to F and RF.  She tolerates well, no c/o soreness at end.   PATIENT EDUCATION: Education details: See tx section above for details  Person educated: Patient Education method: Verbal Instruction, Teach back, Handouts  Education comprehension: States and demonstrates understanding, Additional Education required      HOME EXERCISE PROGRAM: Access Code: [email or web address]: [email or web address] Prepared by: NBenito Mccreedy   GOALS: Goals reviewed with patient? Yes     SHORT TERM GOALS: (STG required if POC>30 days)   Pt will demo/state understanding of advanced HEP to improve pain levels and prerequisite motion. Target date: 06/08/22 Goal status: On-going     LONG TERM GOALS:   Pt will improve functional ability by decreased impairment per Quick DASH assessment from 34% to 10% or better, for better quality of life. Target date: 05/18/22 Goal status: Progressing as on 05/16/22: pt reports 13.63% impairment   2.  Pt will improve grip strength in dom right hand to at least 40 lbs for functional use at home and in IADLs. Target date: 05/18/22 Goal  status: 05/07/22: Improved to 11# non-painful when last tested, needs more work   3.  Pt will improve A/ROM in right ring and small finger TAM to at least 190*, to have functional motion for tasks like reach and grasp.  Target date: 05/18/22 Goal status: 05/07/22: RF improved to 93* TAM now and SF is 44* TAM   05/16/22: RF improved to 130* TAM now and SF is 74* TAM  LONG TERM GOALS:   Pt will improve functional ability by decreased impairment per Quick DASH assessment from 34% to 10% or better, for better quality of life. Target date: 06/22/22 Goal status: Progressing as on 05/16/22: pt reports 13.63% impairment   2.  Pt will improve grip strength in dom right hand to at least 40 lbs for functional use at home and in IADLs. Target date: 06/22/22 Goal status: 05/07/22: Improved to 11# non-painful when last tested, needs more work   3.  Pt will improve A/ROM in right ring and small finger TAM to at least 190*, to have functional motion for tasks like reach and grasp.  Target date: 06/22/22 Goal status: 05/07/22: RF improved to 93* TAM now and SF is 44* TAM   05/16/22: RF improved to 130* TAM now and SF is 74* TAM       ASSESSMENT:   CLINICAL IMPRESSION: 06/18/22: She is likely maximizing benefit from OP OT, and will be reassessed per POC next session for likely D/C to self-care.  She still has stiffness in RF and SF, but it's better as well as swelling is better.  Other issues like apparent wrist OA and pain is somewhat limiting. No c/o of home or work tasks, now though.    06/11/22: SF motion continues to improve slightly. OT is very clear that she must be doing strong stretches 3-4 x day and using her hand as much as possible  to make progress at this point. She states understanding and will continue to do HEP. She is due for reassessment by the end of next week and will likely have maximized her progress at that point.      PLAN: OT FREQUENCY: 1-2 / week as able (12 additional visits)    OT  DURATION: 8 weeks (through 06/22/22)   PLANNED INTERVENTIONS: self care/ADL training, therapeutic exercise, therapeutic activity, neuromuscular re-education, manual therapy, scar mobilization, passive range of motion, splinting, electrical stimulation, ultrasound, fluidotherapy, compression bandaging, moist heat, cryotherapy, contrast bath, patient/family education, coping strategies training, and DME and/or AE instructions   RECOMMENDED OTHER SERVICES: none now    CONSULTED AND AGREED WITH PLAN OF CARE: Patient   PLAN FOR NEXT SESSION:  Reassess for likely d/c for max progress met, unless there are significant changes and goals still need addressed.    Benito Mccreedy, OTR/L, CHT 06/18/2022, 11:25 AM

## 2022-06-20 ENCOUNTER — Encounter: Payer: Medicare HMO | Admitting: Rehabilitative and Restorative Service Providers"

## 2022-06-20 ENCOUNTER — Ambulatory Visit: Payer: Medicare HMO | Admitting: Rehabilitative and Restorative Service Providers"

## 2022-06-20 ENCOUNTER — Encounter: Payer: Self-pay | Admitting: Rehabilitative and Restorative Service Providers"

## 2022-06-20 DIAGNOSIS — M25641 Stiffness of right hand, not elsewhere classified: Secondary | ICD-10-CM | POA: Diagnosis not present

## 2022-06-20 DIAGNOSIS — R278 Other lack of coordination: Secondary | ICD-10-CM

## 2022-06-20 DIAGNOSIS — M6281 Muscle weakness (generalized): Secondary | ICD-10-CM

## 2022-06-20 NOTE — Therapy (Signed)
OUTPATIENT OCCUPATIONAL THERAPY TREATMENT & DISCHARGE NOTE   Patient Name: Alice Scott MRN: [id number] DOB:10-12-1951, 71 y.o., female Today's Date: 06/20/2022  PCP: Vicenta Aly, FNP REFERRING PROVIDER: Dr. Sherilyn Cooter      END OF SESSION:   OT End of Session - 06/20/22 1338     Visit Number 20    Number of Visits 22    Date for OT Re-Evaluation 06/22/22    Authorization Type [employer] - approved through 06/22/22    Authorization - Visit Number 20    Progress Note Due on Visit 20    OT Start Time 1340    OT Stop Time 1430    OT Time Calculation (min) 50 min    Activity Tolerance Patient tolerated treatment well;No increased pain    Behavior During Therapy WFL for tasks assessed/performed              Past Medical History:  Diagnosis Date   Anemia    Arthritis    Diabetes mellitus without complication (HCC)    GERD (gastroesophageal reflux disease)    Gout    Hypothyroidism    Past Surgical History:  Procedure Laterality Date   ABDOMINAL HYSTERECTOMY     APPENDECTOMY     arthroscopic knee Right    CHOLECYSTECTOMY     CLOSED REDUCTION FINGER WITH PERCUTANEOUS PINNING Right 03/07/2022   Procedure: RIGHT RING AND SMALL FINGER CLOSED REDUCTION  WITH PERCUTANEOUS PINNING;  Surgeon: Sherilyn Cooter, MD;  Location: [facility];  Service: Orthopedics;  Laterality: Right;   GASTRIC BYPASS  2004   TOTAL KNEE ARTHROPLASTY Right 05/13/2013   Procedure: TOTAL KNEE ARTHROPLASTY;  Surgeon: Newt Minion, MD;  Location: [facility];  Service: Orthopedics;  Laterality: Right;  Right Total Knee Arthroplasty   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  Thermoplast splint.  Edema control and ROM."   PRECAUTIONS:  15 weeks post-op now; WBAT now    WEIGHT BEARING RESTRICTIONS Not now   SUBJECTIVE:  Pt states doing well and had no I/ADL problems recently, or pain except for in her arthritic neck/back.  Work has been going well, dynamic orthotic needs some new pieces as hers is wearing out a bit.    PAIN:  Are you having pain?  No   Rating: 0/10 at rest now   OBJECTIVE: (All objective assessments below are from initial evaluation on: 03/21/22 unless otherwise specified.)   HAND DOMINANCE: Right    ADLs: Overall ADLs: States decreased ability to grab, hold household objects, pain and inability to open containers, perform all FMS tasks, etc.      FUNCTIONAL OUTCOME MEASURES: 06/20/22: Quick DASH 0% impairment  Eval: Quick DASH 34% impairment today     UPPER EXTREMITY ROM  Active ROM Right eval Right 06/20/22  Wrist flexion 64 60  Wrist extension 41 61  (Blank rows = not tested)   Active ROM Right eval Right 06/20/22  Ring MCP (0-90) 0- 26 0- 59  Ring PIP (0-100) 0- 5 (-18) - 65  Ring DIP (0-70) 0- 9 0- 29  Little MCP (0-90) 0-20  0- 55  Little PIP (0-100) 0- 1 (-12) - 33  Little DIP (0-70) 0- 6 0- 12  (Blank rows = not tested)     UPPER EXTREMITY MMT:    06/20/22: Full strength in right hand now, with some mild soreness near base of thumb with testing     MMT Right 06/06/22 Right  06/20/22  Elbow flexion 4+/5    Elbow extension 4+/5     Wrist flexion 4/5  4+/5  Wrist extension  4+/5 5/5  (Blank rows = not tested)    HAND FUNCTION: 06/20/22: 16.3# Right hand grip  05/01/22: Grip strength Right: 11 lbs, Left: 34 lbs   COORDINATION: 06/20/22: Box and Blocks Test: 53 Right  Blocks today (53 is WFL)   SENSATION: Eval:  no complaints, intact   EDEMA:             06/20/22: 19.7cm circumferentially around right MCP Js today (compared to 19.3cm left hand) WNL   Eval:  Mildly swollen in hand and wrist today, 20.1cm circumferentially around MCP Js   OBSERVATIONS:          06/20/22: She is doing very well, MCP Js slightly looser compared to IP Js now, especially in SF. No guarding now    TODAY'S TREATMENT:  06/20/22: Pt performs AROM, gripping, strength, and coordination activities with right hand/wrist against resistance for exercise/activities as well as new measures today. Using that data, OT also reviews home exercises and provides finalized HEP suggestions. OT also discusses home and functional tasks with the pt and reviews goals. Pt states having no issues, understanding HEP and recommendations. OT does modify dynamic finger flexion orthotic to have added piece that blocks MCP J flexion to put more effort into IP Js now.  This works well, and she likes it, can use independently at home or not, depending on her needs.    PATIENT EDUCATION: Education details: See tx section above for details  Person educated: Patient Education method: Verbal Instruction, Teach back, Handouts  Education comprehension: States and demonstrates understanding, Additional Education required      HOME EXERCISE PROGRAM: Access Code: [email or web address] URL: [email or web address] Prepared by: Benito Mccreedy    GOALS: Goals reviewed with patient? Yes     SHORT TERM GOALS: (STG required if POC>30 days)   Pt will demo/state understanding of advanced HEP to improve pain levels and prerequisite motion. Target date: 06/08/22 Goal status:  06/20/22: MET    LONG TERM GOALS:   Pt will improve functional ability by decreased impairment per Quick DASH assessment from 34% to 10% or better, for better quality of life. Target date: 06/22/22 Goal status: 06/20/22: MET, 0% now   2.  Pt will improve grip strength in dom right hand to at least 40 lbs for functional use at home and in IADLs. Target date: 06/22/22 Goal status: 06/20/22: Not Met, but it has improved. Likely not met due to remaining finger stiffness.    3.  Pt will improve A/ROM in right ring and small finger TAM to at least 190*, to have functional motion for tasks like reach and grasp.  Target date: 06/22/22 Goal status:  06/20/22: not met, but has been improving slowly; RF now 135* and SF now 88* TAM (improved from 93* and 44*, respectively)      ASSESSMENT:   CLINICAL IMPRESSION: 06/20/22: She has maximized benefits from OP OT- has met most goals and made good progress towards others.  She has full HEP and dynamic orthotic that she can continue to wear to work on motion.  She is happy with improvements and will continue to work on it over time.     PLAN: OT FREQUENCY: D/C now    OT DURATION: D/C now    PLANNED INTERVENTIONS: self care/ADL training, therapeutic exercise, therapeutic activity, neuromuscular re-education, manual therapy, scar mobilization, passive range of motion, splinting, electrical stimulation, ultrasound, fluidotherapy, compression bandaging, moist heat, cryotherapy, contrast bath, patient/family education, coping strategies training, and DME and/or AE instructions   RECOMMENDED OTHER SERVICES: none now    CONSULTED AND AGREED WITH PLAN OF CARE: Patient   PLAN FOR NEXT SESSION:  D/C for max potential met from OP OT today    Benito Mccreedy, OTR/L, CHT 06/20/2022, 4:11 PM   OCCUPATIONAL THERAPY DISCHARGE SUMMARY  Visits from Start of Care: 20  Current functional level related to goals / functional outcomes: Pt has met most goals to  satisfactory levels and is pleased with outcomes.   Remaining deficits: Pt has no more significant functional deficits or pain.   Education / Equipment: Pt has all needed materials and education. Pt understands how to continue on with self-management. See tx notes for more details.   Patient agrees to discharge due to max benefits received from outpatient occupational therapy / hand therapy at this time.   Benito Mccreedy, OTR/L, CHT 06/20/22

## 2022-07-03 ENCOUNTER — Ambulatory Visit: Payer: Medicare HMO | Admitting: Family

## 2022-07-03 DIAGNOSIS — M722 Plantar fascial fibromatosis: Secondary | ICD-10-CM

## 2022-07-03 NOTE — Progress Notes (Signed)
Office Visit Note   Patient: Alice Scott           Date of Birth: [birth year]           MRN: [id number] Visit Date: 07/03/2022              Requested by: Elizabeth Palau, FNP [id number] 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 Plantar fasciitis of left foot     Plan: Discussed conservative management of plantar fasciitis on the left encourage supportive shoe wear support the arch. Heel cord stretching demonstrated.  We will not use NSAIDs.  She may continue with her Tylenol for pain relief use ice. heel cord stretching  Follow-Up Instructions: No follow-ups on file.   Ortho Exam  Patient is alert, oriented, no adenopathy, well-dressed, normal affect, normal respiratory effort. On examination of LLE, pes cavus foot. There is pain to origin of plantar fascia with medial left heel tenderness. No pain with lateral compression of calcaneus. Heel cord tightness with dorsiflexion to neutral.   Imaging: No results found. No images are attached to the encounter.  Labs: No results found for: "HGBA1C", "ESRSEDRATE", "CRP", "LABURIC", "REPTSTATUS", "GRAMSTAIN", "CULT", "LABORGA"   Lab Results  Component Value Date   ALBUMIN 3.8 05/01/2013    No results found for: "MG" No results found for: "VD25OH"  No results found for: "PREALBUMIN"    Latest Ref Rng & Units 05/16/2013    4:26 AM 05/15/2013    5:25 AM  05/14/2013    5:44 AM  CBC EXTENDED  WBC 4.0 - 10.5 K/uL 7.2  7.7  9.1   RBC 3.87 - 5.11 MIL/uL 2.93  3.14  3.45   Hemoglobin 12.0 - 15.0 g/dL 9.6  24.2  35.3   HCT 61.4 - 46.0 % 27.9  30.0  32.9   Platelets 150 - 400 K/uL 135  139  165      There is no height or weight on file to calculate BMI.  Orders:  No orders of the defined types were placed in this encounter.  No orders of the defined types were placed in this encounter.    Procedures: No procedures performed  Clinical Data: No additional findings.  ROS:  All other systems negative, except as noted in the HPI. Review of Systems  Objective: Vital Signs: There were no vitals taken for this visit.  Specialty Comments:  No specialty comments available.  PMFS History: 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   Past Medical History:  Diagnosis Date   Anemia    Arthritis    Diabetes mellitus without complication (HCC)    GERD (gastroesophageal reflux  disease)    Gout    Hypothyroidism     No family history on file.  Past Surgical History:  Procedure Laterality Date   ABDOMINAL HYSTERECTOMY     APPENDECTOMY     arthroscopic knee Right    CHOLECYSTECTOMY     CLOSED REDUCTION FINGER WITH PERCUTANEOUS PINNING Right 03/07/2022   Procedure: RIGHT RING AND SMALL FINGER CLOSED REDUCTION  WITH PERCUTANEOUS PINNING;  Surgeon: Marlyne Beards, MD;  Location: [facility];  Service: Orthopedics;  Laterality: Right;   GASTRIC BYPASS  2004   TOTAL KNEE ARTHROPLASTY Right 05/13/2013   Procedure: TOTAL KNEE ARTHROPLASTY;  Surgeon: Nadara Mustard, MD;  Location: [facility];  Service: Orthopedics;  Laterality: Right;  Right Total Knee Arthroplasty   Social History   Occupational History   Not on  file  Tobacco Use   Smoking status: Never   Smokeless tobacco: Never  Substance and Sexual Activity   Alcohol use: No   Drug use: No   Sexual activity: Not on file

## 2022-07-24 ENCOUNTER — Ambulatory Visit: Payer: Medicare HMO | Admitting: Orthopedic Surgery

## 2022-07-24 DIAGNOSIS — M722 Plantar fascial fibromatosis: Secondary | ICD-10-CM | POA: Diagnosis not present

## 2022-07-24 DIAGNOSIS — M1712 Unilateral primary osteoarthritis, left knee: Secondary | ICD-10-CM

## 2022-07-31 ENCOUNTER — Encounter: Payer: Self-pay | Admitting: Orthopedic Surgery

## 2022-07-31 DIAGNOSIS — M1712 Unilateral primary osteoarthritis, left knee: Secondary | ICD-10-CM

## 2022-07-31 MED ORDER — LIDOCAINE HCL (PF) 1 % IJ SOLN
5.0000 mL | INTRAMUSCULAR | Status: AC | PRN
  Administered 2022-07-31: 5 mL

## 2022-07-31 MED ORDER — METHYLPREDNISOLONE ACETATE 40 MG/ML IJ SUSP
40.0000 mg | INTRAMUSCULAR | Status: AC | PRN
  Administered 2022-07-31: 40 mg via INTRA_ARTICULAR

## 2022-07-31 NOTE — Progress Notes (Signed)
Office Visit Note   Patient: Alice Scott           Date of Birth: 1951-03-05           MRN: [id number] Visit Date: 07/24/2022              Requested by: Vicenta Aly, [address] [place],  [place] [address] PCP: Vicenta Aly, FNP  Chief Complaint  Patient presents with   Left Knee - Pain      HPI: Patient is a 71 year old woman who presents in follow-up for osteoarthritis left knee as well as planter fasciitis left foot.  Patient states that the injection she had last year did help her left knee but the pain is reoccurred.  Patient states that she obtain flip-flops on [employer] and this has helped her planter fasciitis.  Assessment & Plan: Visit Diagnoses:  1. Plantar fasciitis of left foot   2. Unilateral primary osteoarthritis, left knee     Plan: Left knee was injected she tolerated this well continue with Achilles stretching.  Follow-Up Instructions: Return if symptoms worsen or fail to improve.   Ortho Exam  Patient is alert, oriented, no adenopathy, well-dressed, normal affect, normal respiratory effort. Examination patient has crepitation with range of motion of the left knee there is no effusion no cellulitis the medial lateral joint lines are tender to palpation collaterals cruciates are stable.  Patient's Planter fasciitis is asymptomatic at this time.  Imaging: No results found. No images are attached to the encounter.  Labs: No results found for: "HGBA1C", "ESRSEDRATE", "CRP", "LABURIC", "REPTSTATUS", "GRAMSTAIN", "CULT", "LABORGA"   Lab Results  Component Value Date   ALBUMIN 3.8 05/01/2013    No results found for: "MG" No results found for: "VD25OH"  No results found for: "PREALBUMIN"    Latest Ref Rng & Units 05/16/2013    4:26 AM 05/15/2013    5:25 AM 05/14/2013    5:44 AM  CBC EXTENDED  WBC 4.0 - 10.5 K/uL 7.2  7.7  9.1   RBC 3.87 - 5.11 MIL/uL 2.93  3.14  3.45   Hemoglobin 12.0 - 15.0 g/dL 9.6  10.3  11.1   HCT  36.0 - 46.0 % 27.9  30.0  32.9   Platelets 150 - 400 K/uL 135  139  165      There is no height or weight on file to calculate BMI.  Orders:  No orders of the defined types were placed in this encounter.  No orders of the defined types were placed in this encounter.    Procedures: Large Joint Inj: L knee on 07/31/2022 11:31 AM Indications: pain and diagnostic evaluation Details: 22 G 1.5 in needle, anteromedial approach  Arthrogram: No  Medications: 5 mL lidocaine (PF) 1 %; 40 mg methylPREDNISolone acetate 40 MG/ML Outcome: tolerated well, no immediate complications Procedure, treatment alternatives, risks and benefits explained, specific risks discussed. Consent was given by the patient. Immediately prior to procedure a time out was called to verify the correct patient, procedure, equipment, support staff and site/side marked as required. Patient was prepped and draped in the usual sterile fashion.      Clinical Data: No additional findings.  ROS:  All other systems negative, except as noted in the HPI. Review of Systems  Objective: Vital Signs: There were no vitals taken for this visit.  Specialty Comments:  No specialty comments available.  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 No pertinent family history.  Past Surgical History:  Procedure Laterality Date   ABDOMINAL HYSTERECTOMY     APPENDECTOMY     arthroscopic knee Right    CHOLECYSTECTOMY     CLOSED REDUCTION FINGER WITH PERCUTANEOUS PINNING Right 03/07/2022    Procedure: RIGHT RING AND SMALL FINGER CLOSED REDUCTION  WITH PERCUTANEOUS PINNING;  Surgeon: Sherilyn Cooter, MD;  Location: [facility];  Service: Orthopedics;  Laterality: Right;   GASTRIC BYPASS  2004   TOTAL KNEE ARTHROPLASTY Right 05/13/2013   Procedure: TOTAL KNEE ARTHROPLASTY;  Surgeon: Newt Minion, MD;  Location: [facility];  Service: Orthopedics;  Laterality: Right;  Right Total Knee Arthroplasty   Social History   Occupational History   Not on file  Tobacco Use   Smoking status: Never   Smokeless tobacco: Never  Substance and Sexual Activity   Alcohol use: No   Drug use: No   Sexual activity: Not on file

## 2022-11-14 ENCOUNTER — Ambulatory Visit: Payer: Medicare HMO | Admitting: Family

## 2022-12-11 ENCOUNTER — Ambulatory Visit (INDEPENDENT_AMBULATORY_CARE_PROVIDER_SITE_OTHER): Payer: Medicare HMO | Admitting: Orthopedic Surgery

## 2022-12-11 ENCOUNTER — Encounter: Payer: Self-pay | Admitting: Orthopedic Surgery

## 2022-12-11 DIAGNOSIS — M1712 Unilateral primary osteoarthritis, left knee: Secondary | ICD-10-CM | POA: Diagnosis not present

## 2022-12-11 MED ORDER — METHYLPREDNISOLONE ACETATE 40 MG/ML IJ SUSP
40.0000 mg | INTRAMUSCULAR | Status: AC | PRN
  Administered 2022-12-11: 40 mg via INTRA_ARTICULAR

## 2022-12-11 MED ORDER — LIDOCAINE HCL (PF) 1 % IJ SOLN
5.0000 mL | INTRAMUSCULAR | Status: AC | PRN
  Administered 2022-12-11: 5 mL

## 2022-12-11 NOTE — Progress Notes (Signed)
Office Visit Note   Patient: Alice Scott           Date of Birth: 1951/04/02           MRN: [id number] Visit Date: 12/11/2022              Requested by: Vicenta Aly, [address] [place],  [place] [address] PCP: Vicenta Aly, FNP  Chief Complaint  Patient presents with   Left Knee - Follow-up    Cortisone injection      HPI: Patient is a 72 year old woman who presents with worsening arthritis left knee.  Patient is status post a right total knee arthroplasty that is functioning well.  Assessment & Plan: Visit Diagnoses:  1. Unilateral primary osteoarthritis, left knee     Plan: Patient would like to proceed with a left total knee arthroplasty.  We will set this up at her convenience risks and benefits were discussed including infection neurovascular injury persistent pain need for additional surgery.  Patient states she understands wished to proceed.  Anticipate overnight observation.  Follow-Up Instructions: Return in about 2 weeks (around 12/25/2022), or if symptoms worsen or fail to improve.   Ortho Exam  Patient is alert, oriented, no adenopathy, well-dressed, normal affect, normal respiratory effort. Examination patient has varus alignment to her left knee she has bone-on-bone contact medially.  She has a mild effusion.  Collaterals and cruciates are stable.  Review of the radiographs shows a stable total knee arthroplasty on the right.  Left knee radiographs show tricompartmental arthritic changes periarticular bony spurs subcondylar cyst and sclerosis as well as bony spurs in the patella.  Imaging: No results found. No images are attached to the encounter.  Labs: No results found for: "HGBA1C", "ESRSEDRATE", "CRP", "LABURIC", "REPTSTATUS", "GRAMSTAIN", "CULT", "LABORGA"   Lab Results  Component Value Date   ALBUMIN 3.8 05/01/2013    No results found for: "MG" No results found for: "VD25OH"  No results found for:  "PREALBUMIN"    Latest Ref Rng & Units 05/16/2013    4:26 AM 05/15/2013    5:25 AM 05/14/2013    5:44 AM  CBC EXTENDED  WBC 4.0 - 10.5 K/uL 7.2  7.7  9.1   RBC 3.87 - 5.11 MIL/uL 2.93  3.14  3.45   Hemoglobin 12.0 - 15.0 g/dL 9.6  10.3  11.1   HCT 36.0 - 46.0 % 27.9  30.0  32.9   Platelets 150 - 400 K/uL 135  139  165      There is no height or weight on file to calculate BMI.  Orders:  No orders of the defined types were placed in this encounter.  No orders of the defined types were placed in this encounter.    Procedures: Large Joint Inj: L knee on 12/11/2022 5:21 PM Indications: pain and diagnostic evaluation Details: 22 G 1.5 in needle, anteromedial approach  Arthrogram: No  Medications: 5 mL lidocaine (PF) 1 %; 40 mg methylPREDNISolone acetate 40 MG/ML Outcome: tolerated well, no immediate complications Procedure, treatment alternatives, risks and benefits explained, specific risks discussed. Consent was given by the patient. Immediately prior to procedure a time out was called to verify the correct patient, procedure, equipment, support staff and site/side marked as required. Patient was prepped and draped in the usual sterile fashion.      Clinical Data: No additional findings.  ROS:  All other systems negative, except as noted in the HPI. Review of Systems  Objective: Vital  Signs: There were no vitals taken for this visit.  Specialty Comments:  No specialty comments available.  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 No  pertinent family history.  Past Surgical History:  Procedure Laterality Date   ABDOMINAL HYSTERECTOMY     APPENDECTOMY     arthroscopic knee Right    CHOLECYSTECTOMY     CLOSED REDUCTION FINGER WITH PERCUTANEOUS PINNING Right 03/07/2022   Procedure: RIGHT RING AND SMALL FINGER CLOSED REDUCTION  WITH PERCUTANEOUS PINNING;  Surgeon: Sherilyn Cooter, MD;  Location: [facility];  Service: Orthopedics;  Laterality: Right;   GASTRIC BYPASS  2004   TOTAL KNEE ARTHROPLASTY Right 05/13/2013   Procedure: TOTAL KNEE ARTHROPLASTY;  Surgeon: Newt Minion, MD;  Location: [facility];  Service: Orthopedics;  Laterality: Right;  Right Total Knee Arthroplasty   Social History   Occupational History   Not on file  Tobacco Use   Smoking status: Never   Smokeless tobacco: Never  Substance and Sexual Activity   Alcohol use: No   Drug use: No   Sexual activity: Not on file

## 2022-12-12 ENCOUNTER — Ambulatory Visit: Payer: Medicare HMO | Admitting: Family

## 2023-01-15 ENCOUNTER — Other Ambulatory Visit: Payer: Self-pay

## 2023-02-06 ENCOUNTER — Encounter (HOSPITAL_COMMUNITY): Payer: Self-pay

## 2023-02-06 NOTE — Pre-Procedure Instructions (Signed)
Surgical Instructions    Your procedure is scheduled on Friday, April 26th.  Report to [facility] Main Entrance "A" at 05:30 A.M., then check in with the Admitting office.  Call this number if you have problems the morning of surgery:  [phone]  If you have any questions prior to your surgery date call [phone]: Open Monday-Friday 8am-4pm If you experience any cold or flu symptoms such as cough, fever, chills, shortness of breath, etc. between now and your scheduled surgery, please notify us at the above number.     Remember:  Do not eat after midnight the night before your surgery  You may drink clear liquids until 04:30 AM the morning of your surgery.   Clear liquids allowed are: Water, Non-Citrus Juices (without pulp), Carbonated Beverages, Clear Tea, Black Coffee Only (NO MILK, CREAM OR POWDERED CREAMER of any kind), and Gatorade.   Patient Instructions  The night before surgery:  No food after midnight. ONLY clear liquids after midnight  The day of surgery (if you do NOT have diabetes):  Drink ONE (1) Pre-Surgery Clear Ensure by 04:30 AM the morning of surgery. Drink in one sitting. Do not sip.  This drink was given to you during your hospital  pre-op appointment visit.  Nothing else to drink after completing the  Pre-Surgery Clear Ensure.          If you have questions, please contact your surgeon's office.     Take these medicines the morning of surgery with A SIP OF WATER  levothyroxine (SYNTHROID)  omeprazole (PRILOSEC)  oxybutynin (DITROPAN-XL)    If needed: acetaminophen (TYLENOL)  gabapentin (NEURONTIN)    As of today, STOP taking any Aspirin (unless otherwise instructed by your surgeon) Aleve, Naproxen, Ibuprofen, Motrin, Advil, Goody's, BC's, all herbal medications, fish oil, and all vitamins.                     Do NOT Smoke (Tobacco/Vaping) for 24 hours prior to your procedure.  If you use a CPAP at night, you may bring your mask/headgear for  your overnight stay.   Contacts, glasses, piercing's, hearing aid's, dentures or partials may not be worn into surgery, please bring cases for these belongings.    For patients admitted to the hospital, discharge time will be determined by your treatment team.   Patients discharged the day of surgery will not be allowed to drive home, and someone needs to stay with them for 24 hours.  SURGICAL WAITING ROOM VISITATION Patients having surgery or a procedure may have no more than 2 support people in the waiting area - these visitors may rotate.   Children under the age of 6 must have an adult with them who is not the patient. If the patient needs to stay at the hospital during part of their recovery, the visitor guidelines for inpatient rooms apply. Pre-op nurse will coordinate an appropriate time for 1 support person to accompany patient in pre-op.  This support person may not rotate.   Please refer to the [facility] website for the visitor guidelines for Inpatients (after your surgery is over and you are in a regular room).       Please read over the following fact sheets that you were given.    If you received a COVID test during your pre-op visit  it is requested that you wear a mask when out in public, stay away from anyone that may not be feeling well and notify your surgeon if  you develop symptoms. If you have been in contact with anyone that has tested positive in the last 10 days please notify you surgeon.

## 2023-02-07 ENCOUNTER — Other Ambulatory Visit: Payer: Self-pay

## 2023-02-07 ENCOUNTER — Encounter (HOSPITAL_COMMUNITY)
Admission: RE | Admit: 2023-02-07 | Discharge: 2023-02-07 | Disposition: A | Discharge status: Home / Self Care | Payer: Medicare HMO | Source: Ambulatory Visit | Attending: Orthopedic Surgery | Admitting: Orthopedic Surgery

## 2023-02-07 ENCOUNTER — Telehealth: Payer: Self-pay | Admitting: Orthopedic Surgery

## 2023-02-07 ENCOUNTER — Encounter (HOSPITAL_COMMUNITY): Payer: Self-pay | Admitting: *Deleted

## 2023-02-07 VITALS — BP 118/53 | HR 67 | Temp 97.9°F | Resp 18 | Ht 61.0 in | Wt 204.8 lb

## 2023-02-07 DIAGNOSIS — Z01812 Encounter for preprocedural laboratory examination: Secondary | ICD-10-CM | POA: Diagnosis present

## 2023-02-07 DIAGNOSIS — Z01818 Encounter for other preprocedural examination: Secondary | ICD-10-CM

## 2023-02-07 DIAGNOSIS — I10 Essential (primary) hypertension: Secondary | ICD-10-CM | POA: Diagnosis not present

## 2023-02-07 HISTORY — DX: Chronic lymphocytic leukemia of B-cell type not having achieved remission: C91.10

## 2023-02-07 HISTORY — DX: Personal history of urinary calculi: Z87.442

## 2023-02-07 HISTORY — DX: Prediabetes: R73.03

## 2023-02-07 HISTORY — DX: Essential (primary) hypertension: I10

## 2023-02-07 HISTORY — DX: Unilateral primary osteoarthritis, left knee: M17.12

## 2023-02-07 LAB — SURGICAL PCR SCREEN
MRSA, PCR: NEGATIVE
Staphylococcus aureus: POSITIVE — AB

## 2023-02-07 LAB — CBC
HCT: 37.4 % (ref 36.0–46.0)
Hemoglobin: 12.6 g/dL (ref 12.0–15.0)
MCH: 34.1 pg — ABNORMAL HIGH (ref 26.0–34.0)
MCHC: 33.7 g/dL (ref 30.0–36.0)
MCV: 101.1 fL — ABNORMAL HIGH (ref 80.0–100.0)
Platelets: 183 10*3/uL (ref 150–400)
RBC: 3.7 MIL/uL — ABNORMAL LOW (ref 3.87–5.11)
RDW: 12.9 % (ref 11.5–15.5)
WBC: 10.5 10*3/uL (ref 4.0–10.5)
nRBC: 0 % (ref 0.0–0.2)

## 2023-02-07 LAB — GLUCOSE, CAPILLARY: Glucose-Capillary: 129 mg/dL — ABNORMAL HIGH (ref 70–99)

## 2023-02-07 LAB — BASIC METABOLIC PANEL
Anion gap: 7 (ref 5–15)
BUN: 27 mg/dL — ABNORMAL HIGH (ref 8–23)
CO2: 21 mmol/L — ABNORMAL LOW (ref 22–32)
Calcium: 9.1 mg/dL (ref 8.9–10.3)
Chloride: 113 mmol/L — ABNORMAL HIGH (ref 98–111)
Creatinine, Ser: 1.24 mg/dL — ABNORMAL HIGH (ref 0.44–1.00)
GFR, Estimated: 46 mL/min — ABNORMAL LOW (ref >60)
Glucose, Bld: 139 mg/dL — ABNORMAL HIGH (ref 70–99)
Potassium: 4.3 mmol/L (ref 3.5–5.1)
Sodium: 141 mmol/L (ref 135–145)

## 2023-02-07 NOTE — Telephone Encounter (Signed)
Called pt to advise that letter is at the front desk for pick up. She will have a left total knee on 02/15/2023 and will be out of work for an estimate of 6-8 weeks for recovery.

## 2023-02-07 NOTE — Progress Notes (Signed)
PCP - Ladora Daniel, PA-C Cardiologist - denies  PPM/ICD - denies   Chest x-ray - 05/01/13 EKG - 03/05/22 Stress Test - denies ECHO - denies Cardiac Cath - denies  Sleep Study - 10 years ago per pt, Moderate OSA per pt. She said no interventions were needed CPAP - denies  DM- pt states she is pre-diabetic and no longer takes diabetes medication  Last dose of GLP1 agonist-  n/a   ASA/Blood Thinner Instructions: n/a   ERAS Protcol - yes PRE-SURGERY G2- given at PAT  COVID TEST- n/a   Anesthesia review: no  Patient denies shortness of breath, fever, cough and chest pain at PAT appointment   All instructions explained to the patient, with a verbal understanding of the material. Patient agrees to go over the instructions while at home for a better understanding. The opportunity to ask questions was provided.

## 2023-02-07 NOTE — Telephone Encounter (Signed)
Patient called. She needs a note stating her day of surgery and how long she will be out of work. Her call back number is [phone]

## 2023-02-14 NOTE — Anesthesia Preprocedure Evaluation (Addendum)
Anesthesia Evaluation  Patient identified by MRN, date of birth, ID band Patient awake    Reviewed: Allergy & Precautions, NPO status , Patient's Chart, lab work & pertinent test results  Airway Mallampati: II  TM Distance: >3 FB Neck ROM: Full    Dental no notable dental hx.    Pulmonary neg pulmonary ROS   Pulmonary exam normal        Cardiovascular hypertension, Pt. on medications  Rhythm:Regular Rate:Normal     Neuro/Psych negative neurological ROS  negative psych ROS   GI/Hepatic Neg liver ROS,GERD  Medicated,,  Endo/Other  Hypothyroidism    Renal/GU negative Renal ROS  negative genitourinary   Musculoskeletal  (+) Arthritis , Osteoarthritis,    Abdominal Normal abdominal exam  (+)   Peds  Hematology  (+) Blood dyscrasia, anemia Lab Results      Component                Value               Date                      WBC                      10.5                02/07/2023                HGB                      12.6                02/07/2023                HCT                      37.4                02/07/2023                MCV                      101.1 (H)           02/07/2023                PLT                      183                 02/07/2023             Lab Results      Component                Value               Date                      NA                       141                 02/07/2023                K  4.3                 02/07/2023                CO2                      21 (L)              02/07/2023                GLUCOSE                  139 (H)             02/07/2023                BUN                      27 (H)              02/07/2023                CREATININE               1.24 (H)            02/07/2023                CALCIUM                  9.1                 02/07/2023                GFRNONAA                 46 (L)              02/07/2023               Anesthesia Other Findings   Reproductive/Obstetrics                             Anesthesia Physical Anesthesia Plan  ASA: 2  Anesthesia Plan: MAC, Spinal and Regional   Post-op Pain Management: Regional block*, Celebrex PO (pre-op)* and Tylenol PO (pre-op)*   Induction: Intravenous  PONV Risk Score and Plan: 2 and Ondansetron, Dexamethasone, Propofol infusion and Treatment may vary due to age or medical condition  Airway Management Planned: Simple Face Mask and Nasal Cannula  Additional Equipment: None  Intra-op Plan:   Post-operative Plan:   Informed Consent: I have reviewed the patients History and Physical, chart, labs and discussed the procedure including the risks, benefits and alternatives for the proposed anesthesia with the patient or authorized representative who has indicated his/her understanding and acceptance.     Dental advisory given  Plan Discussed with: CRNA  Anesthesia Plan Comments:        Anesthesia Quick Evaluation

## 2023-02-15 ENCOUNTER — Observation Stay (HOSPITAL_COMMUNITY)
Admission: RE | Admit: 2023-02-15 | Discharge: 2023-02-16 | Disposition: A | Discharge status: Home Health | Payer: Medicare HMO | Attending: Orthopedic Surgery | Admitting: Orthopedic Surgery

## 2023-02-15 ENCOUNTER — Other Ambulatory Visit: Payer: Self-pay

## 2023-02-15 ENCOUNTER — Ambulatory Visit (HOSPITAL_COMMUNITY): Payer: Medicare HMO | Admitting: Anesthesiology

## 2023-02-15 ENCOUNTER — Encounter (HOSPITAL_COMMUNITY): Payer: Self-pay | Admitting: Orthopedic Surgery

## 2023-02-15 ENCOUNTER — Ambulatory Visit (HOSPITAL_BASED_OUTPATIENT_CLINIC_OR_DEPARTMENT_OTHER): Payer: Medicare HMO | Admitting: Anesthesiology

## 2023-02-15 ENCOUNTER — Encounter (HOSPITAL_COMMUNITY)
Admission: RE | Disposition: A | Discharge status: Home Health | Payer: Self-pay | Source: Home / Self Care | Attending: Orthopedic Surgery

## 2023-02-15 DIAGNOSIS — E039 Hypothyroidism, unspecified: Secondary | ICD-10-CM | POA: Diagnosis not present

## 2023-02-15 DIAGNOSIS — Z79899 Other long term (current) drug therapy: Secondary | ICD-10-CM | POA: Insufficient documentation

## 2023-02-15 DIAGNOSIS — D638 Anemia in other chronic diseases classified elsewhere: Secondary | ICD-10-CM

## 2023-02-15 DIAGNOSIS — M1712 Unilateral primary osteoarthritis, left knee: Secondary | ICD-10-CM | POA: Diagnosis not present

## 2023-02-15 DIAGNOSIS — I10 Essential (primary) hypertension: Secondary | ICD-10-CM | POA: Insufficient documentation

## 2023-02-15 DIAGNOSIS — Z96651 Presence of right artificial knee joint: Secondary | ICD-10-CM | POA: Insufficient documentation

## 2023-02-15 DIAGNOSIS — Z96652 Presence of left artificial knee joint: Secondary | ICD-10-CM

## 2023-02-15 HISTORY — PX: TOTAL KNEE ARTHROPLASTY: SHX125

## 2023-02-15 SURGERY — ARTHROPLASTY, KNEE, TOTAL
Anesthesia: Monitor Anesthesia Care | Site: Knee | Laterality: Left

## 2023-02-15 MED ORDER — OXYCODONE HCL 5 MG PO TABS: 5.0000 mg | ORAL_TABLET | ORAL | Status: DC | PRN

## 2023-02-15 MED ORDER — TRANEXAMIC ACID-NACL 1000-0.7 MG/100ML-% IV SOLN
INTRAVENOUS | Status: AC
  Filled 2023-02-15: qty 100

## 2023-02-15 MED ORDER — ACETAMINOPHEN 500 MG PO TABS
1000.0000 mg | ORAL_TABLET | Freq: Once | ORAL | Status: AC
  Administered 2023-02-15: 1000 mg via ORAL

## 2023-02-15 MED ORDER — FENTANYL CITRATE (PF) 100 MCG/2ML IJ SOLN
INTRAMUSCULAR | Status: DC | PRN
  Administered 2023-02-15: 50 ug via INTRAVENOUS

## 2023-02-15 MED ORDER — MAGNESIUM CITRATE PO SOLN
1.0000 | Freq: Once | ORAL | Status: DC | PRN
  Filled 2023-02-15: qty 296

## 2023-02-15 MED ORDER — CELECOXIB 200 MG PO CAPS
ORAL_CAPSULE | ORAL | Status: AC
  Filled 2023-02-15: qty 1

## 2023-02-15 MED ORDER — FENTANYL CITRATE (PF) 100 MCG/2ML IJ SOLN
INTRAMUSCULAR | Status: AC
  Filled 2023-02-15: qty 2

## 2023-02-15 MED ORDER — ORAL CARE MOUTH RINSE: 15.0000 mL | Freq: Once | OROMUCOSAL | Status: AC

## 2023-02-15 MED ORDER — HYDROCODONE-ACETAMINOPHEN 5-325 MG PO TABS
1.0000 | ORAL_TABLET | Freq: Four times a day (QID) | ORAL | Status: DC | PRN
  Administered 2023-02-15 – 2023-02-16 (×2): 2 via ORAL
  Filled 2023-02-15 (×2): qty 2

## 2023-02-15 MED ORDER — ACETAMINOPHEN 325 MG PO TABS: 325.0000 mg | ORAL_TABLET | Freq: Four times a day (QID) | ORAL | Status: DC | PRN

## 2023-02-15 MED ORDER — CEFAZOLIN SODIUM-DEXTROSE 2-4 GM/100ML-% IV SOLN
2.0000 g | INTRAVENOUS | Status: AC
  Administered 2023-02-15: 2 g via INTRAVENOUS

## 2023-02-15 MED ORDER — PROPOFOL 10 MG/ML IV BOLUS
INTRAVENOUS | Status: AC
  Filled 2023-02-15: qty 20

## 2023-02-15 MED ORDER — ONDANSETRON HCL 4 MG/2ML IJ SOLN
INTRAMUSCULAR | Status: AC
  Filled 2023-02-15: qty 2

## 2023-02-15 MED ORDER — HYDROMORPHONE HCL 1 MG/ML IJ SOLN
0.5000 mg | INTRAMUSCULAR | Status: DC | PRN
  Administered 2023-02-15: 1 mg via INTRAVENOUS
  Filled 2023-02-15: qty 1

## 2023-02-15 MED ORDER — CHLORHEXIDINE GLUCONATE 0.12 % MT SOLN
15.0000 mL | Freq: Once | OROMUCOSAL | Status: AC
  Administered 2023-02-15: 15 mL via OROMUCOSAL

## 2023-02-15 MED ORDER — BISACODYL 10 MG RE SUPP: 10.0000 mg | Freq: Every day | RECTAL | Status: DC | PRN

## 2023-02-15 MED ORDER — CELECOXIB 200 MG PO CAPS
200.0000 mg | ORAL_CAPSULE | Freq: Once | ORAL | Status: AC
  Administered 2023-02-15: 200 mg via ORAL

## 2023-02-15 MED ORDER — ACETAMINOPHEN 500 MG PO TABS
ORAL_TABLET | ORAL | Status: AC
  Filled 2023-02-15: qty 2

## 2023-02-15 MED ORDER — METHOCARBAMOL 1000 MG/10ML IJ SOLN
500.0000 mg | Freq: Four times a day (QID) | INTRAVENOUS | Status: DC | PRN
  Filled 2023-02-15: qty 5

## 2023-02-15 MED ORDER — DOCUSATE SODIUM 100 MG PO CAPS
100.0000 mg | ORAL_CAPSULE | Freq: Two times a day (BID) | ORAL | Status: DC
  Administered 2023-02-15 – 2023-02-16 (×2): 100 mg via ORAL
  Filled 2023-02-15 (×3): qty 1

## 2023-02-15 MED ORDER — ROPIVACAINE HCL 7.5 MG/ML IJ SOLN
INTRAMUSCULAR | Status: DC | PRN
  Administered 2023-02-15: 20 mL via PERINEURAL

## 2023-02-15 MED ORDER — TRANEXAMIC ACID 1000 MG/10ML IV SOLN
2000.0000 mg | Freq: Once | INTRAVENOUS | Status: AC
  Administered 2023-02-15: 2000 mg via TOPICAL
  Filled 2023-02-15: qty 20

## 2023-02-15 MED ORDER — CHLORHEXIDINE GLUCONATE 0.12 % MT SOLN
OROMUCOSAL | Status: AC
  Filled 2023-02-15: qty 15

## 2023-02-15 MED ORDER — CEFAZOLIN SODIUM-DEXTROSE 2-4 GM/100ML-% IV SOLN
2.0000 g | Freq: Four times a day (QID) | INTRAVENOUS | Status: AC
  Administered 2023-02-15 (×2): 2 g via INTRAVENOUS
  Filled 2023-02-15 (×2): qty 100

## 2023-02-15 MED ORDER — BUPIVACAINE IN DEXTROSE 0.75-8.25 % IT SOLN
INTRATHECAL | Status: DC | PRN
  Administered 2023-02-15: 1.6 mL via INTRATHECAL

## 2023-02-15 MED ORDER — MENTHOL 3 MG MT LOZG
1.0000 | LOZENGE | OROMUCOSAL | Status: DC | PRN
  Filled 2023-02-15: qty 9

## 2023-02-15 MED ORDER — ONDANSETRON HCL 4 MG/2ML IJ SOLN
INTRAMUSCULAR | Status: DC | PRN
  Administered 2023-02-15: 4 mg via INTRAVENOUS

## 2023-02-15 MED ORDER — TRANEXAMIC ACID-NACL 1000-0.7 MG/100ML-% IV SOLN
INTRAVENOUS | Status: DC | PRN
  Administered 2023-02-15: 1000 mg via INTRAVENOUS

## 2023-02-15 MED ORDER — PHENOL 1.4 % MT LIQD
1.0000 | OROMUCOSAL | Status: DC | PRN
  Filled 2023-02-15: qty 177

## 2023-02-15 MED ORDER — KETOROLAC TROMETHAMINE 15 MG/ML IJ SOLN
7.5000 mg | Freq: Four times a day (QID) | INTRAMUSCULAR | Status: DC | PRN
  Administered 2023-02-15: 7.5 mg via INTRAVENOUS
  Filled 2023-02-15: qty 1

## 2023-02-15 MED ORDER — SODIUM CHLORIDE 0.9 % IR SOLN
Status: DC | PRN
  Administered 2023-02-15: 3000 mL

## 2023-02-15 MED ORDER — GABAPENTIN 100 MG PO CAPS: 100.0000 mg | ORAL_CAPSULE | Freq: Two times a day (BID) | ORAL | Status: DC | PRN

## 2023-02-15 MED ORDER — DIPHENHYDRAMINE HCL 12.5 MG/5ML PO ELIX: 12.5000 mg | ORAL_SOLUTION | ORAL | Status: DC | PRN

## 2023-02-15 MED ORDER — LACTATED RINGERS IV SOLN: INTRAVENOUS | Status: DC

## 2023-02-15 MED ORDER — ASPIRIN 325 MG PO TBEC
325.0000 mg | DELAYED_RELEASE_TABLET | Freq: Every day | ORAL | Status: DC
  Administered 2023-02-16: 325 mg via ORAL
  Filled 2023-02-15: qty 1

## 2023-02-15 MED ORDER — LEVOTHYROXINE SODIUM 150 MCG PO TABS
150.0000 ug | ORAL_TABLET | Freq: Every day | ORAL | Status: DC
  Administered 2023-02-16: 150 ug via ORAL
  Filled 2023-02-15: qty 1
  Filled 2023-02-15: qty 2

## 2023-02-15 MED ORDER — LOSARTAN POTASSIUM 50 MG PO TABS
100.0000 mg | ORAL_TABLET | Freq: Every day | ORAL | Status: DC
  Administered 2023-02-15 – 2023-02-16 (×2): 100 mg via ORAL
  Filled 2023-02-15 (×2): qty 2

## 2023-02-15 MED ORDER — METOCLOPRAMIDE HCL 5 MG PO TABS: 5.0000 mg | ORAL_TABLET | Freq: Three times a day (TID) | ORAL | Status: DC | PRN

## 2023-02-15 MED ORDER — PHENYLEPHRINE HCL-NACL 20-0.9 MG/250ML-% IV SOLN
INTRAVENOUS | Status: DC | PRN
  Administered 2023-02-15: 25 ug/min via INTRAVENOUS

## 2023-02-15 MED ORDER — OXYCODONE HCL 5 MG PO TABS
10.0000 mg | ORAL_TABLET | ORAL | Status: DC | PRN
  Filled 2023-02-15: qty 3

## 2023-02-15 MED ORDER — MIDAZOLAM HCL 2 MG/2ML IJ SOLN
INTRAMUSCULAR | Status: AC
  Filled 2023-02-15: qty 2

## 2023-02-15 MED ORDER — 0.9 % SODIUM CHLORIDE (POUR BTL) OPTIME
TOPICAL | Status: DC | PRN
  Administered 2023-02-15: 1000 mL

## 2023-02-15 MED ORDER — POLYETHYLENE GLYCOL 3350 17 G PO PACK: 17.0000 g | PACK | Freq: Every day | ORAL | Status: DC | PRN

## 2023-02-15 MED ORDER — FENTANYL CITRATE (PF) 100 MCG/2ML IJ SOLN
25.0000 ug | INTRAMUSCULAR | Status: DC | PRN
  Administered 2023-02-15: 50 ug via INTRAVENOUS

## 2023-02-15 MED ORDER — PROPOFOL 10 MG/ML IV BOLUS
INTRAVENOUS | Status: DC | PRN
  Administered 2023-02-15 (×2): 20 mg via INTRAVENOUS
  Administered 2023-02-15: 10 mg via INTRAVENOUS

## 2023-02-15 MED ORDER — METOCLOPRAMIDE HCL 5 MG/ML IJ SOLN
5.0000 mg | Freq: Three times a day (TID) | INTRAMUSCULAR | Status: DC | PRN
  Administered 2023-02-15: 10 mg via INTRAVENOUS
  Filled 2023-02-15: qty 2

## 2023-02-15 MED ORDER — ONDANSETRON HCL 4 MG/2ML IJ SOLN
4.0000 mg | Freq: Four times a day (QID) | INTRAMUSCULAR | Status: DC | PRN
  Administered 2023-02-15: 4 mg via INTRAVENOUS
  Filled 2023-02-15: qty 2

## 2023-02-15 MED ORDER — ONDANSETRON HCL 4 MG PO TABS: 4.0000 mg | ORAL_TABLET | Freq: Four times a day (QID) | ORAL | Status: DC | PRN

## 2023-02-15 MED ORDER — PHENYLEPHRINE 80 MCG/ML (10ML) SYRINGE FOR IV PUSH (FOR BLOOD PRESSURE SUPPORT)
PREFILLED_SYRINGE | INTRAVENOUS | Status: DC | PRN
  Administered 2023-02-15: 160 ug via INTRAVENOUS

## 2023-02-15 MED ORDER — SODIUM CHLORIDE 0.9 % IV SOLN: INTRAVENOUS | Status: DC

## 2023-02-15 MED ORDER — CEFAZOLIN SODIUM-DEXTROSE 2-4 GM/100ML-% IV SOLN
INTRAVENOUS | Status: AC
  Filled 2023-02-15: qty 100

## 2023-02-15 MED ORDER — OXYBUTYNIN CHLORIDE ER 10 MG PO TB24
10.0000 mg | ORAL_TABLET | Freq: Every day | ORAL | Status: DC
  Administered 2023-02-16: 10 mg via ORAL
  Filled 2023-02-15: qty 1

## 2023-02-15 MED ORDER — METHOCARBAMOL 500 MG PO TABS: 500.0000 mg | ORAL_TABLET | Freq: Four times a day (QID) | ORAL | Status: DC | PRN

## 2023-02-15 MED ORDER — PROPOFOL 500 MG/50ML IV EMUL
INTRAVENOUS | Status: DC | PRN
  Administered 2023-02-15: 75 ug/kg/min via INTRAVENOUS

## 2023-02-15 SURGICAL SUPPLY — 63 items
BAG COUNTER SPONGE SURGICOUNT (BAG) ×1 IMPLANT
BAG SPNG CNTER NS LX DISP (BAG) ×1
BLADE SAGITTAL 25.0X1.19X90 (BLADE) ×1 IMPLANT
BLADE SAW SGTL 13X75X1.27 (BLADE) ×1 IMPLANT
BLADE SURG 21 STRL SS (BLADE) ×2 IMPLANT
BNDG CMPR 5X6 CHSV STRCH STRL (GAUZE/BANDAGES/DRESSINGS) ×1
BNDG COHESIVE 6X5 TAN NS LF (GAUZE/BANDAGES/DRESSINGS) IMPLANT
BNDG COHESIVE 6X5 TAN ST LF (GAUZE/BANDAGES/DRESSINGS) ×2 IMPLANT
BNDG GAUZE DERMACEA FLUFF 4 (GAUZE/BANDAGES/DRESSINGS) ×1 IMPLANT
BNDG GZE DERMACEA 4 6PLY (GAUZE/BANDAGES/DRESSINGS) ×1
BOWL SMART MIX CTS (DISPOSABLE) ×1 IMPLANT
BSPLAT TIB 5D C CMNT STM LT (Knees) ×1 IMPLANT
CEMENT BONE R 1X40 (Cement) ×4 IMPLANT
CEMENT BONE REFOBACIN R1X40 US (Cement) IMPLANT
COOLER ICEMAN CLASSIC (MISCELLANEOUS) ×1 IMPLANT
COVER SURGICAL LIGHT HANDLE (MISCELLANEOUS) ×1 IMPLANT
CUFF TOURN SGL QUICK 34 (TOURNIQUET CUFF) ×1
CUFF TOURN SGL QUICK 42 (TOURNIQUET CUFF) IMPLANT
CUFF TRNQT CYL 34X4.125X (TOURNIQUET CUFF) ×1 IMPLANT
DRAPE EXTREMITY T 121X128X90 (DISPOSABLE) ×1 IMPLANT
DRAPE HALF SHEET 40X57 (DRAPES) ×2 IMPLANT
DRAPE U-SHAPE 47X51 STRL (DRAPES) ×1 IMPLANT
DRSG ADAPTIC 3X8 NADH LF (GAUZE/BANDAGES/DRESSINGS) ×1 IMPLANT
DURAPREP 26ML APPLICATOR (WOUND CARE) ×1 IMPLANT
ELECT REM PT RETURN 9FT ADLT (ELECTROSURGICAL) ×1
ELECTRODE REM PT RTRN 9FT ADLT (ELECTROSURGICAL) ×1 IMPLANT
FACESHIELD WRAPAROUND (MASK) IMPLANT
FACESHIELD WRAPAROUND OR TEAM (MASK) ×1 IMPLANT
FEMUR CMT CR STD SZ 6 LT KNEE (Joint) ×1 IMPLANT
FEMUR CMTD CR STD SZ 6 LT KNEE (Joint) IMPLANT
GAUZE PAD ABD 8X10 STRL (GAUZE/BANDAGES/DRESSINGS) ×1 IMPLANT
GAUZE SPONGE 4X4 12PLY STRL (GAUZE/BANDAGES/DRESSINGS) ×1 IMPLANT
GLOVE BIOGEL PI IND STRL 9 (GLOVE) ×1 IMPLANT
GLOVE SURG ORTHO 9.0 STRL STRW (GLOVE) ×1 IMPLANT
GOWN STRL REUS W/ TWL XL LVL3 (GOWN DISPOSABLE) ×2 IMPLANT
GOWN STRL REUS W/TWL XL LVL3 (GOWN DISPOSABLE) ×2
HANDPIECE INTERPULSE COAX TIP (DISPOSABLE) ×1
HDLS TROCR DRIL PIN KNEE 75 (PIN) ×1
INSERT TIB ASF 14 6-7/CD LT (Insert) IMPLANT
KIT BASIN OR (CUSTOM PROCEDURE TRAY) ×1 IMPLANT
KIT TURNOVER KIT B (KITS) ×1 IMPLANT
MANIFOLD NEPTUNE II (INSTRUMENTS) ×1 IMPLANT
NS IRRIG 1000ML POUR BTL (IV SOLUTION) ×1 IMPLANT
PACK TOTAL JOINT (CUSTOM PROCEDURE TRAY) ×1 IMPLANT
PAD ABD 8X10 STRL (GAUZE/BANDAGES/DRESSINGS) IMPLANT
PAD ARMBOARD 7.5X6 YLW CONV (MISCELLANEOUS) ×1 IMPLANT
PAD COLD SHLDR WRAP-ON (PAD) ×1 IMPLANT
PIN DRILL HDLS TROCAR 75 4PK (PIN) IMPLANT
SCREW FEMALE HEX FIX 25X2.5 (ORTHOPEDIC DISPOSABLE SUPPLIES) IMPLANT
SET HNDPC FAN SPRY TIP SCT (DISPOSABLE) ×1 IMPLANT
STAPLER VISISTAT 35W (STAPLE) ×1 IMPLANT
STEM POLY PAT PLY 29M KNEE (Knees) IMPLANT
STEM TIB ST PERS 14+30 (Stem) IMPLANT
STEM TIBIA 5 DEG SZ C L KNEE (Knees) IMPLANT
SUCTION FRAZIER HANDLE 10FR (MISCELLANEOUS)
SUCTION TUBE FRAZIER 10FR DISP (MISCELLANEOUS) IMPLANT
SUT VIC AB 0 CT1 27 (SUTURE) ×1
SUT VIC AB 0 CT1 27XBRD ANBCTR (SUTURE) ×1 IMPLANT
SUT VIC AB 1 CTX 36 (SUTURE) ×1
SUT VIC AB 1 CTX36XBRD ANBCTR (SUTURE) IMPLANT
TIBIA STEM 5 DEG SZ C L KNEE (Knees) ×1 IMPLANT
TOWEL GREEN STERILE (TOWEL DISPOSABLE) ×1 IMPLANT
TOWEL GREEN STERILE FF (TOWEL DISPOSABLE) ×1 IMPLANT

## 2023-02-15 NOTE — Anesthesia Procedure Notes (Signed)
Anesthesia Regional Block: Adductor canal block   Pre-Anesthetic Checklist: , timeout performed,  Correct Patient, Correct Site, Correct Laterality,  Correct Procedure, Correct Position, site marked,  Risks and benefits discussed,  Surgical consent,  Pre-op evaluation,  At surgeon's request and post-op pain management  Laterality: Left  Prep: Dura Prep       Needles:  Injection technique: Single-shot  Needle Type: Echogenic Stimulator Needle     Needle Length: 10cm  Needle Gauge: 20     Additional Needles:   Procedures:,,,, ultrasound used (permanent image in chart),,    Narrative:  Start time: 02/15/2023 7:00 AM End time: 02/15/2023 7:05 AM Injection made incrementally with aspirations every 5 mL.  Performed by: Personally  Anesthesiologist: Atilano Median, DO  Additional Notes: Patient identified. Risks/Benefits/Options discussed with patient including but not limited to bleeding, infection, nerve damage, failed block, incomplete pain control. Patient expressed understanding and wished to proceed. All questions were answered. Sterile technique was used throughout the entire procedure. Please see nursing notes for vital signs. Aspirated in 5cc intervals with injection for negative confirmation. Patient was given instructions on fall risk and not to get out of bed. All questions and concerns addressed with instructions to call with any issues or inadequate analgesia.

## 2023-02-15 NOTE — Transfer of Care (Signed)
Immediate Anesthesia Transfer of Care Note  Patient: Alice Scott  Procedure(s) Performed: LEFT TOTAL KNEE REPLACEMENT (Left: Knee)  Patient Location: PACU  Anesthesia Type:Spinal and MAC combined with regional for post-op pain  Level of Consciousness: awake and patient cooperative  Airway & Oxygen Therapy: Patient Spontanous Breathing  Post-op Assessment: Report given to RN and Post -op Vital signs reviewed and stable  Post vital signs: Reviewed and stable  Last Vitals:  Vitals Value Taken Time  BP 107/57 02/15/23 0917  Temp    Pulse 67 02/15/23 0923  Resp 14 02/15/23 0923  SpO2 96 % 02/15/23 0923  Vitals shown include unvalidated device data.  Last Pain:  Vitals:   02/15/23 0559  TempSrc:   PainSc: 0-No pain      Patients Stated Pain Goal: 0 (02/15/23 0559)  Complications: No notable events documented.

## 2023-02-15 NOTE — Op Note (Signed)
DATE OF SURGERY:  02/15/2023  TIME: 9:16 AM  PATIENT NAME:  Alice Scott    AGE: 72 y.o.    PRE-OPERATIVE DIAGNOSIS:  osteoarthritis left knee  POST-OPERATIVE DIAGNOSIS:  * No post-op diagnosis entered *  PROCEDURE:  Procedure(s): LEFT TOTAL KNEE REPLACEMENT  SURGEON: Aldean Baker  ASSISTANT: Joey Dixon  OPERATIVE IMPLANTS: [employer]immer Persona.  Femur size ^, Tibia size C , Patella size 29 3-peg oval button, with a 14 mm polyethylene insert medial congruent.  @ENCIMAGES @  Implant Name Type Inv. Item Serial No. Manufacturer Lot No. LRB No. Used Action  CEMENT BONE R 1X40 - [id number] Cement CEMENT BONE R 1X40  ZIMMER RECON(ORTH,TRAU,BIO,SG) [id number] Left 1 Implanted  CEMENT BONE REFOBACIN R1X40 [place] - [id number] Cement CEMENT BONE REFOBACIN R1X40 [place]  ZIMMER RECON(ORTH,TRAU,BIO,SG) [id number] Left 1 Implanted  FEMUR CMT CR STD SZ 6 LT KNEE - [id number] Joint FEMUR CMT CR STD SZ 6 LT KNEE  ZIMMER RECON(ORTH,TRAU,BIO,SG)  Left 1 Implanted  TIBIA STEM 5 DEG SZ C L KNEE - [id number] Knees TIBIA STEM 5 DEG SZ C L KNEE  ZIMMER RECON(ORTH,TRAU,BIO,SG) [id number] Left 1 Implanted  INSERT TIB ASF 14 6-7/CD LT - [id number] Insert INSERT TIB ASF 14 6-7/CD LT  ZIMMER RECON(ORTH,TRAU,BIO,SG) [id number] Left 1 Implanted  STEM TIB ST PERS 14+30 - [id number] Stem STEM TIB ST PERS 14+30  ZIMMER RECON(ORTH,TRAU,BIO,SG) [id number] Left 1 Implanted  STEM POLY PAT PLY 59M KNEE - [id number] Knees STEM POLY PAT PLY 59M KNEE  ZIMMER RECON(ORTH,TRAU,BIO,SG) [id number] Left 1 Implanted      PREOPERATIVE INDICATIONS:   Alice Scott is a 72 y.o. year old female with end stage degenerative arthritis of the knee who failed conservative treatment and elected for Total Knee Arthroplasty.   The risks, benefits, and alternatives were discussed at length including but not limited to the risks of infection, bleeding, nerve injury, stiffness, blood clots, the need for revision surgery, cardiopulmonary complications,  among others, and they were willing to proceed.  OPERATIVE DESCRIPTION:  The patient was brought to the operative room and placed in a supine position.  General anesthesia was administered.  IV antibiotics were given.  The lower extremity was prepped and draped in the usual sterile fashion.  Alice Scott was used to cover all exposed skin. Time out was performed.    Anterior quadriceps tendon splitting approach was performed.  The patella was everted and osteophytes were removed.  The anterior horn of the medial and lateral meniscus was removed.   The distal femur was opened with the drill and the intramedullary distal femoral cutting jig was utilized, set at 5 degrees valgus resecting 9 mm off the distal femur.  Care was taken to protect the collateral ligaments.  Then the extramedullary tibial cutting jig was utilized set for 3 degree posterior slope.  Care was taken during the cut to protect the medial and collateral ligaments.  The proximal tibia was removed along with the posterior horns of the menisci.  The PCL was sacrificed.    The extensor gap was measured and was approximately 14 mm.    The distal femoral sizing jig was applied, taking care to avoid notching.  Then the 4-in-1 cutting jig was applied and the anterior and posterior femur was cut, along with the chamfer cuts.  All posterior osteophytes were removed.  The flexion gap was then measured and was symmetric with the extension gap.  The distal femoral preparation using the appropriate jig to prepare the box.  The patella was  then measured, and cut with the saw.    The proximal tibia sized and prepared accordingly with the reamer and the punch, and then all components were trialed with the poly insert.  The knee was found to have stable balance and full motion.  The knee was irrigated with normal saline and the knee was soaked with TXA.  The above named components were then cemented into place and all excess cement was removed.  The  final polyethylene component was in place during cementation.  The knee was kept in extension until the cement hardened.  The knee was then taken through a range of motion and the patella tracked well and the knee irrigated copiously and the parapatellar and subcutaneous tissue closed with vicryl, and skin closed with staples..  A sterile dressing was applied and patient  was taken to the PACU in stable  condition.  There were no complications.  Total tourniquet time was 40 minutes.

## 2023-02-15 NOTE — Discharge Instructions (Signed)

## 2023-02-15 NOTE — Anesthesia Procedure Notes (Signed)
Spinal  Patient location during procedure: OR Start time: 02/15/2023 7:44 AM End time: 02/15/2023 7:47 AM Staffing Performed: anesthesiologist  Anesthesiologist: Atilano Median, DO Performed by: Atilano Median, DO Authorized by: Atilano Median, DO   Preanesthetic Checklist Completed: patient identified, IV checked, site marked, risks and benefits discussed, surgical consent, monitors and equipment checked, pre-op evaluation and timeout performed Spinal Block Patient position: sitting Prep: DuraPrep Patient monitoring: heart rate, cardiac monitor, continuous pulse ox and blood pressure Approach: midline Location: L3-4 Injection technique: single-shot Needle Needle type: Pencan  Needle gauge: 24 G Needle length: 10 cm Assessment Events: CSF return Additional Notes Patient identified. Risks/Benefits/Options discussed with patient including but not limited to bleeding, infection, nerve damage, paralysis, failed block, incomplete pain control, headache, blood pressure changes, nausea, vomiting, reactions to medications, itching and postpartum back pain. Confirmed with bedside nurse the patient's most recent platelet count. Confirmed with patient that they are not currently taking any anticoagulation, have any bleeding history or any family history of bleeding disorders. Patient expressed understanding and wished to proceed. All questions were answered. Sterile technique was used throughout the entire procedure. Please see nursing notes for vital signs. Warning signs of high block given to the patient including shortness of breath, tingling/numbness in hands, complete motor block, or any concerning symptoms with instructions to call for help. Patient was given instructions on fall risk and not to get out of bed. All questions and concerns addressed with instructions to call with any issues or inadequate analgesia.

## 2023-02-15 NOTE — H&P (Signed)
TOTAL KNEE ADMISSION H&P  Patient is being admitted for left total knee arthroplasty.  Subjective:  Chief Complaint:left knee pain.  HPI: Alice Scott, 72 y.o. female, has a history of pain and functional disability in the left knee due to arthritis and has failed non-surgical conservative treatments for greater than 12 weeks to includeNSAID's and/or analgesics, corticosteriod injections, use of assistive devices, weight reduction as appropriate, and activity modification.  Onset of symptoms was gradual, starting 8 years ago with gradually worsening course since that time. The patient noted no past surgery on the left knee(s).  Patient currently rates pain in the left knee(s) at 8 out of 10 with activity. Patient has night pain, worsening of pain with activity and weight bearing, pain that interferes with activities of daily living, pain with passive range of motion, crepitus, and joint swelling.  Patient has evidence of subchondral cysts, subchondral sclerosis, periarticular osteophytes, joint subluxation, and joint space narrowing by imaging studies. This patient has had avascular necrosis of the knee. There is no active infection.  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   Past Medical History:  Diagnosis Date   Anemia    Arthritis    CLL (chronic lymphocytic leukemia) (HCC)    pt states she has not needed treatment   GERD (gastroesophageal reflux disease)    Gout    History of kidney stones    Hypertension    Hypothyroidism    Osteoarthritis of left knee    Pre-diabetes    no longer takes medication    Past Surgical History:  Procedure Laterality Date   ABDOMINAL HYSTERECTOMY     APPENDECTOMY     arthroscopic knee Right    CHOLECYSTECTOMY     CLOSED  REDUCTION FINGER WITH PERCUTANEOUS PINNING Right 03/07/2022   Procedure: RIGHT RING AND SMALL FINGER CLOSED REDUCTION  WITH PERCUTANEOUS PINNING;  Surgeon: Marlyne Beards, MD;  Location:  [facility];  Service: Orthopedics;  Laterality: Right;   GASTRIC BYPASS  2004   TOTAL KNEE ARTHROPLASTY Right 05/13/2013   Procedure: TOTAL KNEE ARTHROPLASTY;  Surgeon: Nadara Mustard, MD;  Location: [facility] OR;  Service: Orthopedics;  Laterality: Right;  Right Total Knee Arthroplasty    Current Facility-Administered Medications  Medication Dose Route Frequency Provider Last Rate Last Admin   acetaminophen (TYLENOL) 500 MG tablet            ceFAZolin (ANCEF) 2-4 GM/100ML-% IVPB            ceFAZolin (ANCEF) IVPB 2g/100 mL premix  2 g Intravenous On Call to OR Nadara Mustard, MD       celecoxib (CELEBREX) 200 MG capsule            chlorhexidine (PERIDEX) 0.12 % solution            lactated ringers infusion   Intravenous Continuous Ellender, Catheryn Bacon, MD       Allergies  Allergen Reactions   Tramadol Rash    Discoloration of R leg   Codeine Nausea And Vomiting   Morphine And Related Nausea And Vomiting    Social History   Tobacco Use   Smoking status: Never   Smokeless tobacco: Never  Substance Use Topics   Alcohol use: No    History reviewed. No pertinent  family history.   Review of Systems  All other systems reviewed and are negative.   Objective:  Physical Exam  Vital signs in last 24 hours: Temp:  [97.7 F (36.5 C)] 97.7 F (36.5 C) (04/26 0548) Pulse Rate:  [73] 73 (04/26 0548) Resp:  [18] 18 (04/26 0548) BP: (117)/(50) 117/50 (04/26 0548) SpO2:  [93 %] 93 % (04/26 0548) Weight:  [93 kg] 93 kg (04/26 0548)  Labs:   Estimated body mass index is 38.73 kg/m as calculated from the following:   Height as of this encounter: 5\' 1"  (1.549 m).   Weight as of this encounter: 93 kg.   Imaging Review Plain radiographs demonstrate moderate degenerative joint disease of the  left knee(s). The overall alignment ismild varus. The bone quality appears to be adequate for age and reported activity level.      Assessment/Plan:  End stage arthritis, left knee   The patient history, physical examination, clinical judgment of the provider and imaging studies are consistent with end stage degenerative joint disease of the left knee(s) and total knee arthroplasty is deemed medically necessary. The treatment options including medical management, injection therapy arthroscopy and arthroplasty were discussed at length. The risks and benefits of total knee arthroplasty were presented and reviewed. The risks due to aseptic loosening, infection, stiffness, patella tracking problems, thromboembolic complications and other imponderables were discussed. The patient acknowledged the explanation, agreed to proceed with the plan and consent was signed. Patient is being admitted for inpatient treatment for surgery, pain control, PT, OT, prophylactic antibiotics, VTE prophylaxis, progressive ambulation and ADL's and discharge planning. The patient is planning to be discharged home with home health services     Patient's anticipated LOS is less than 2 midnights, meeting these requirements: - Younger than 39 - Lives within 1 hour of care - Has a competent adult at home to recover with post-op recover - NO history of  - Chronic pain requiring opiods  - Diabetes  - Coronary Artery Disease  - Heart failure  - Heart attack  - Stroke  - DVT/VTE  - Cardiac arrhythmia  - Respiratory Failure/COPD  - Renal failure  - Anemia  - Advanced Liver disease

## 2023-02-15 NOTE — Care Management Obs Status (Cosign Needed)
MEDICARE OBSERVATION STATUS NOTIFICATION   Patient Details  Name: Alice Scott MRN: [id number] Date of Birth: 21-Jul-1951   Medicare Observation Status Notification Given:  Yes    Janae Bridgeman, RN 02/15/2023, 11:53 AM

## 2023-02-15 NOTE — Progress Notes (Signed)
Report given to nurse made aware of bladder scan results and patient has not void

## 2023-02-15 NOTE — TOC Initial Note (Signed)
Transition of Care [facility]) - Initial/Assessment Note    Patient Details  Name: Alice Scott MRN: [id number] Date of Birth: 08/26/1951  Transition [facility]) CM/SW Contact:    Janae Bridgeman, RN Phone Number: 02/15/2023, 12:06 PM  Clinical Narrative:                 CM met with the patient at the bedside to discuss TOC needs.  The patient is S/P Left total knee arthroplasty today with Dr. Lajoyce Corners - and plans to return home with home health services.  Librarian, academic at bedside.  The patient has RW and 3:1 currently at home since patient has history of right Total knee arthroplasty in the past.  Patient is aware that [facility] will follow up with patient in the home for PT.  Active orders noted for [facility] present in EPIC orders - and documented in the AVS.  Patient is pending PT/OT eval and will likely discharge home with family tomorrow.  Medicare Observation letter provided to the patient at the bedside.  Daughter present.  No other TOC needs and patient can discharge home by car tomorrow once cleared by PT/MD.  Expected Discharge Plan: Home w Home Health Services Barriers to Discharge: Continued Medical Work up   Patient Goals and CMS Choice Patient states their goals for this hospitalization and ongoing recovery are:: To return home CMS Medicare.gov Compare Post Acute Care list provided to:: Patient Choice offered to / list presented to : Patient  ownership interest in [employer] [facility] [employer] [facility].provided to:: Patient    Expected Discharge Plan and Services   Discharge Planning Services: CM Consult Post Acute Care Choice: Home Health Living arrangements for the past 2 months: Single Family Home                           HH Arranged: PT HH Agency: [facility] Date [facility] Agency Contacted: 02/15/23 Time HH Agency Contacted: 1205 Representative spoke with at [facility] Agency: Tresa Endo, CM with [facility]  Prior Living Arrangements/Services Living  arrangements for the past 2 months: Single Family Home Lives with:: Self (Patient's aunt plans to assist the patient at the home post-surgery) Patient language and need for interpreter reviewed:: Yes Do you feel safe going back to the place where you live?: Yes      Need for Family Participation in Patient Care: Yes (Comment) Care giver support system in place?: Yes (comment) Current home services: Home PT Criminal Activity/Legal Involvement Pertinent to Current Situation/Hospitalization: No - Comment as needed  Activities of Daily Living      Permission Sought/Granted Permission sought to share information with : Case Manager, Magazine features editor Permission granted to share information with : Yes, Verbal Permission Granted     Permission granted to share info w AGENCY: [facility] following for [facility] needs for PT - orders placed by Dr. Lajoyce Corners  Permission granted to share info w Relationship: daughter at bedside     Emotional Assessment Appearance:: Appears stated age Attitude/Demeanor/Rapport: Gracious Affect (typically observed): Accepting Orientation: : Oriented to Self, Oriented to Place, Oriented to  Time, Oriented to Situation Alcohol / Substance Use: Not Applicable Psych Involvement: No (comment)  Admission diagnosis:  Total knee replacement status, left [Z96.652] Patient Active Problem List   Diagnosis Date Noted   Unilateral primary osteoarthritis, left knee 02/15/2023   Total knee replacement status, left 02/15/2023   Closed displaced fracture of proximal phalanx of right  little finger 03/01/2022   Closed displaced fracture of proximal phalanx of right ring finger 03/01/2022   H/O total knee replacement, 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   PCP:  Ladora Daniel, PA-C Pharmacy:   [facility] [address] [place] - [address] [place] [address] Phone: [phone] Fax: [phone]  [id number] - [place], [place] -  [address] AT [facility] [address] [place] [address] Phone: [phone] Fax: [phone]     Social Determinants of Health (SDOH) Social History: SDOH Screenings   Tobacco Use: Low Risk  (02/15/2023)   SDOH Interventions:     Readmission Risk Interventions     No data to display

## 2023-02-15 NOTE — Evaluation (Signed)
Physical Therapy Evaluation Patient Details Name: Alice Scott MRN: [id number] DOB: August 23, 1951 Today's Date: 02/15/2023  History of Present Illness  Pt is 72 year old presented to [facility] on  02/15/23 for lt TKR. PMH - Rt TKR, htn, CLL, gastric bypass  Clinical Impression  Pt admitted with above diagnosis and presents to PT with functional limitations due to deficits listed below (See PT problem list). Pt needs skilled PT to maximize independence and safety. Today pt limited by multiple bouts of nausea and vomiting. Expect will do well with progressing mobility when that resolves.     Recommendations for follow up therapy are one component of a multi-disciplinary discharge planning process, led by the attending physician.  Recommendations may be updated based on patient status, additional functional criteria and insurance authorization.  Follow Up Recommendations       Assistance Recommended at Discharge Intermittent Supervision/Assistance  Patient can return home with the following  A little help with walking and/or transfers;A little help with bathing/dressing/bathroom;Help with stairs or ramp for entrance;Assist for transportation;Assistance with cooking/housework    Equipment Recommendations None recommended by PT  Recommendations for Other Services       Functional Status Assessment Patient has had a recent decline in their functional status and demonstrates the ability to make significant improvements in function in a reasonable and predictable amount of time.     Precautions / Restrictions Precautions Precautions: Knee Restrictions Weight Bearing Restrictions: Yes      Mobility  Bed Mobility Overal bed mobility: Needs Assistance Bed Mobility: Supine to Sit, Sit to Supine     Supine to sit: Min guard, HOB elevated Sit to supine: Min guard   General bed mobility comments: Incr time and effort    Transfers Overall transfer level: Needs assistance Equipment used:  Rolling walker (2 wheels) Transfers: Sit to/from Stand, Bed to chair/wheelchair/BSC Sit to Stand: Min guard   Step pivot transfers: Min guard       General transfer comment: Assist for safety    Ambulation/Gait Ambulation/Gait assistance: Min guard Gait Distance (Feet): 25 Feet Assistive device: Rolling walker (2 wheels) Gait Pattern/deviations: Decreased step length - right, Decreased stance time - left, Antalgic Gait velocity: decr Gait velocity interpretation: <1.31 ft/sec, indicative of household ambulator   General Gait Details: Assist for Wellsite geologist    Modified Rankin (Stroke Patients Only)       Balance Overall balance assessment: Mild deficits observed, not formally tested                                           Pertinent Vitals/Pain Pain Assessment Pain Assessment: 0-10 Pain Score: 10-Worst pain ever Pain Location: lt knee Pain Descriptors / Indicators: Guarding, Grimacing Pain Intervention(s): Limited activity within patient's tolerance, Monitored during session, Premedicated before session, Repositioned    Home Living Family/patient expects to be discharged to:: Private residence Living Arrangements: Alone Available Help at Discharge: Family;Available 24 hours/day (daughter available) Type of Home: House Home Access: Stairs to enter Entrance Stairs-Rails: Doctor, general practice of Steps: 2   Home Layout: One level Home Equipment: Agricultural consultant (2 wheels);BSC/3in1      Prior Function Prior Level of Function : Independent/Modified Independent  Hand Dominance   Dominant Hand: Right    Extremity/Trunk Assessment   Upper Extremity Assessment Upper Extremity Assessment: Overall WFL for tasks assessed    Lower Extremity Assessment Lower Extremity Assessment: LLE deficits/detail LLE Deficits / Details: knee AAROM grossly 0-80        Communication   Communication: No difficulties  Cognition Arousal/Alertness: Awake/alert Behavior During Therapy: WFL for tasks assessed/performed Overall Cognitive Status: Within Functional Limits for tasks assessed                                          General Comments General comments (skin integrity, edema, etc.): Pt with nausea and vomiting several times during session    Exercises Total Joint Exercises Quad Sets: AROM, Left, 5 reps, Supine   Assessment/Plan    PT Assessment Patient needs continued PT services  PT Problem List Decreased strength;Decreased range of motion;Decreased mobility;Pain       PT Treatment Interventions DME instruction;Gait training;Stair training;Functional mobility training;Therapeutic activities;Therapeutic exercise;Patient/family education    PT Goals (Current goals can be found in the Care Plan section)  Acute Rehab PT Goals Patient Stated Goal: return home PT Goal Formulation: With patient Time For Goal Achievement: 02/22/23 Potential to Achieve Goals: Good    Frequency 7X/week     Co-evaluation               AM-PAC PT "6 Clicks" Mobility  Outcome Measure Help needed turning from your back to your side while in a flat bed without using bedrails?: A Little Help needed moving from lying on your back to sitting on the side of a flat bed without using bedrails?: A Little Help needed moving to and from a bed to a chair (including a wheelchair)?: A Little Help needed standing up from a chair using your arms (e.g., wheelchair or bedside chair)?: A Little Help needed to walk in hospital room?: A Little Help needed climbing 3-5 steps with a railing? : A Little 6 Click Score: 18    End of Session Equipment Utilized During Treatment: Gait belt Activity Tolerance: Other (comment) (Limited by nausea and vomiting) Patient left: in bed;with call bell/phone within reach;with bed alarm set Nurse Communication: Mobility  status PT Visit Diagnosis: Other abnormalities of gait and mobility (R26.89);Pain Pain - Right/Left: Left Pain - part of body: Knee    Time: 1410-1435 PT Time Calculation (min) (ACUTE ONLY): 25 min   Charges:   PT Evaluation $PT Eval Low Complexity: 1 Low PT Treatments $Gait Training: 8-22 mins        [facility] PT Acute Rehabilitation Services Office [phone]   Angelina Ok [facility] 02/15/2023, 2:53 PM

## 2023-02-16 ENCOUNTER — Encounter (HOSPITAL_COMMUNITY): Payer: Self-pay | Admitting: Orthopedic Surgery

## 2023-02-16 DIAGNOSIS — M1712 Unilateral primary osteoarthritis, left knee: Secondary | ICD-10-CM | POA: Diagnosis not present

## 2023-02-16 MED ORDER — OXYCODONE-ACETAMINOPHEN 5-325 MG PO TABS: 1.0000 | ORAL_TABLET | ORAL | 0 refills | Status: AC | PRN

## 2023-02-16 NOTE — Anesthesia Postprocedure Evaluation (Signed)
Anesthesia Post Note  Patient: CARLEENA MIRES  Procedure(s) Performed: LEFT TOTAL KNEE REPLACEMENT (Left: Knee)     Patient location during evaluation: PACU Anesthesia Type: Regional, MAC and Spinal Level of consciousness: awake and alert Pain management: pain level controlled Vital Signs Assessment: post-procedure vital signs reviewed and stable Respiratory status: spontaneous breathing, nonlabored ventilation, respiratory function stable and patient connected to nasal cannula oxygen Cardiovascular status: stable and blood pressure returned to baseline Postop Assessment: no apparent nausea or vomiting Anesthetic complications: no   No notable events documented.  Last Vitals:  Vitals:   02/16/23 0409 02/16/23 0730  BP: 134/61 (!) 125/52  Pulse: 79 70  Resp: 17 14  Temp: 36.9 C 36.7 C  SpO2: 97% 97%    Last Pain:  Vitals:   02/16/23 0730  TempSrc: Oral  PainSc:                  Nelle Don Myisha Pickerel

## 2023-02-16 NOTE — Discharge Summary (Signed)
Discharge Diagnoses:  Principal Problem:   Total knee replacement status, left Active Problems:   Unilateral primary osteoarthritis, left knee   Surgeries: Procedure(s): LEFT TOTAL KNEE REPLACEMENT on 02/15/2023    Consultants:   Discharged Condition: Improved  Hospital Course: Alice Scott is an 72 y.o. female who was admitted 02/15/2023 with a chief complaint of osteoarthritis left knee, with a final diagnosis of osteoarthritis left knee.  Patient was brought to the operating room on 02/15/2023 and underwent Procedure(s): LEFT TOTAL KNEE REPLACEMENT.    Patient was given perioperative antibiotics:  Anti-infectives (From admission, onward)    Start     Dose/Rate Route Frequency Ordered Stop   02/15/23 1400  ceFAZolin (ANCEF) IVPB 2g/100 mL premix        2 g 200 mL/hr over 30 Minutes Intravenous Every 6 hours 02/15/23 1035 02/15/23 2203   02/15/23 0608  ceFAZolin (ANCEF) 2-4 GM/100ML-% IVPB       Note to Pharmacy: Amanda Pea M: cabinet override      02/15/23 0608 02/15/23 0812   02/15/23 0600  ceFAZolin (ANCEF) IVPB 2g/100 mL premix        2 g 200 mL/hr over 30 Minutes Intravenous On call to O.R. 02/15/23 1610 02/15/23 0751     .  Patient was given sequential compression devices, early ambulation, and aspirin for DVT prophylaxis.  Recent vital signs: Patient Vitals for the past 24 hrs:  BP Temp Temp src Pulse Resp SpO2  02/16/23 0730 (!) 125/52 98 F (36.7 C) Oral 70 14 97 %  02/16/23 0409 134/61 98.4 F (36.9 C) -- 79 17 97 %  02/15/23 1934 (!) 123/57 98.3 F (36.8 C) Oral 82 17 99 %  02/15/23 1540 (!) 123/53 97.7 F (36.5 C) Oral 68 -- 100 %  02/15/23 1140 (!) 152/117 (!) 97.5 F (36.4 C) Oral 60 -- 99 %  02/15/23 1125 -- -- -- 63 18 98 %  02/15/23 1120 -- -- -- 69 19 98 %  02/15/23 1115 (!) 116/57 97.9 F (36.6 C) -- 60 13 94 %  02/15/23 1100 (!) 105/59 -- -- (!) 59 15 93 %  02/15/23 1055 -- -- -- (!) 56 13 97 %  02/15/23 1045 (!) 108/53 -- -- (!) 57 17  99 %  02/15/23 1030 (!) 107/49 -- -- (!) 53 15 96 %  02/15/23 1015 (!) 110/52 -- -- 60 20 97 %  02/15/23 1000 (!) 106/52 -- -- (!) 58 15 95 %  02/15/23 0945 (!) 96/52 -- -- 61 17 95 %  02/15/23 0930 (!) 108/57 -- -- 65 13 96 %  02/15/23 0915 (!) 107/57 98.2 F (36.8 C) -- 67 17 95 %  .  Recent laboratory studies: No results found.  Discharge Medications:   Allergies as of 02/16/2023       Reactions   Tramadol Rash   Discoloration of R leg   Codeine Nausea And Vomiting   Morphine And Related Nausea And Vomiting        Medication List     TAKE these medications    acetaminophen 650 MG CR tablet Commonly known as: TYLENOL Take 650 mg by mouth every 8 (eight) hours as needed for pain.   gabapentin 100 MG capsule Commonly known as: NEURONTIN Take 1 capsule by mouth 2 (two) times daily as needed (pain).   levothyroxine 150 MCG tablet Commonly known as: SYNTHROID Take 150 mcg by mouth daily before breakfast.   losartan 100 MG tablet Commonly  known as: COZAAR Take 100 mg by mouth daily.   omeprazole 40 MG capsule Commonly known as: PRILOSEC Take 40 mg by mouth daily.   oxybutynin 5 MG 24 hr tablet Commonly known as: DITROPAN-XL Take 10 mg by mouth daily.   oxyCODONE-acetaminophen 5-325 MG tablet Commonly known as: PERCOCET/ROXICET Take 1 tablet by mouth every 4 (four) hours as needed.   PreserVision AREDS 2 Caps Take 1 capsule by mouth 2 (two) times daily.   Vitamin D (Ergocalciferol) 1.25 MG (50000 UNIT) Caps capsule Commonly known as: DRISDOL Take 50,000 Units by mouth every 7 (seven) days.        Diagnostic Studies: No results found.  Patient benefited maximally from their hospital stay and there were no complications.     Disposition: Discharge disposition: 01-Home or Self Care      Discharge Instructions     Call MD / Call 911   Complete by: As directed    If you experience chest pain or shortness of breath, CALL 911 and be transported to  the hospital emergency room.  If you develope a fever above 101 F, pus (white drainage) or increased drainage or redness at the wound, or calf pain, call your surgeon's office.   Constipation Prevention   Complete by: As directed    Drink plenty of fluids.  Prune juice may be helpful.  You may use a stool softener, such as Colace (over the counter) 100 mg twice a day.  Use MiraLax (over the counter) for constipation as needed.   Diet - low sodium heart healthy   Complete by: As directed    Increase activity slowly as tolerated   Complete by: As directed    Post-operative opioid taper instructions:   Complete by: As directed    POST-OPERATIVE OPIOID TAPER INSTRUCTIONS: It is important to wean off of your opioid medication as soon as possible. If you do not need pain medication after your surgery it is ok to stop day one. Opioids include: Codeine, Hydrocodone(Norco, Vicodin), Oxycodone(Percocet, oxycontin) and hydromorphone amongst others.  Long term and even short term use of opiods can cause: Increased pain response Dependence Constipation Depression Respiratory depression And more.  Withdrawal symptoms can include Flu like symptoms Nausea, vomiting And more Techniques to manage these symptoms Hydrate well Eat regular healthy meals Stay active Use relaxation techniques(deep breathing, meditating, yoga) Do Not substitute Alcohol to help with tapering If you have been on opioids for less than two weeks and do not have pain than it is ok to stop all together.  Plan to wean off of opioids This plan should start within one week post op of your joint replacement. Maintain the same interval or time between taking each dose and first decrease the dose.  Cut the total daily intake of opioids by one tablet each day Next start to increase the time between doses. The last dose that should be eliminated is the evening dose.          Follow-up Information     Nadara Mustard, MD Follow  up in 1 week(s).   Specialty: Orthopedic Surgery Contact information: [address] [place] [address] [phone]         [facility] Follow up.   Specialty: Home Health Services Why: [facility] will provide you with home health PT.  They will call you and set up home health services. Contact information: [address] [place] [address] [phone]  Signed: Nadara Mustard 02/16/2023, 9:00 AM

## 2023-02-16 NOTE — Progress Notes (Signed)
Patient ID: Alice Scott, female   DOB: 1951/06/29, 72 y.o.   MRN: [id number] Patient is postoperative day 1 left total knee arthroplasty.  Patient was up with therapy this morning without problems.  The dressing is dry.  We reviewed her decreased bone mineral density.  She will follow-up with her primary care physician.  Plan for discharge to home today.  She will take her ice machine with her.

## 2023-02-16 NOTE — Progress Notes (Signed)
Patient given discharge instructions and verbalized understanding. Patient family assisted with dressing and PIV x1 removed by RN. Patient discharged home with family.

## 2023-02-16 NOTE — Plan of Care (Signed)
  Problem: Education: Goal: Knowledge of the prescribed therapeutic regimen will improve Outcome: Adequate for Discharge Goal: Individualized Educational Video(s) Outcome: Adequate for Discharge   Problem: Activity: Goal: Ability to avoid complications of mobility impairment will improve Outcome: Adequate for Discharge Goal: Range of joint motion will improve Outcome: Adequate for Discharge   Problem: Clinical Measurements: Goal: Postoperative complications will be avoided or minimized Outcome: Adequate for Discharge   Problem: Pain Management: Goal: Pain level will decrease with appropriate interventions Outcome: Adequate for Discharge   Problem: Skin Integrity: Goal: Will show signs of wound healing Outcome: Adequate for Discharge   Problem: Education: Goal: Knowledge of General Education information will improve Description: Including pain rating scale, medication(s)/side effects and non-pharmacologic comfort measures Outcome: Adequate for Discharge   Problem: Health Behavior/Discharge Planning: Goal: Ability to manage health-related needs will improve Outcome: Adequate for Discharge   Problem: Clinical Measurements: Goal: Ability to maintain clinical measurements within normal limits will improve Outcome: Adequate for Discharge Goal: Will remain free from infection Outcome: Adequate for Discharge Goal: Diagnostic test results will improve Outcome: Adequate for Discharge Goal: Respiratory complications will improve Outcome: Adequate for Discharge Goal: Cardiovascular complication will be avoided Outcome: Adequate for Discharge   Problem: Activity: Goal: Risk for activity intolerance will decrease Outcome: Adequate for Discharge   Problem: Nutrition: Goal: Adequate nutrition will be maintained Outcome: Adequate for Discharge   Problem: Coping: Goal: Level of anxiety will decrease Outcome: Adequate for Discharge   Problem: Elimination: Goal: Will not  experience complications related to bowel motility Outcome: Adequate for Discharge Goal: Will not experience complications related to urinary retention Outcome: Adequate for Discharge   Problem: Pain Managment: Goal: General experience of comfort will improve Outcome: Adequate for Discharge   Problem: Safety: Goal: Ability to remain free from injury will improve Outcome: Adequate for Discharge   Problem: Skin Integrity: Goal: Risk for impaired skin integrity will decrease Outcome: Adequate for Discharge   Problem: Acute Rehab PT Goals(only PT should resolve) Goal: Pt Will Go Supine/Side To Sit Outcome: Adequate for Discharge Goal: Pt Will Go Sit To Supine/Side Outcome: Adequate for Discharge Goal: Patient Will Transfer Sit To/From Stand Outcome: Adequate for Discharge Goal: Pt Will Ambulate Outcome: Adequate for Discharge Goal: Pt Will Go Up/Down Stairs Outcome: Adequate for Discharge   

## 2023-02-16 NOTE — Progress Notes (Signed)
Physical Therapy Treatment Patient Details Name: Alice Scott MRN: [id number] DOB: 1951/08/25 Today's Date: 02/16/2023   History of Present Illness Pt is 72 year old presented to [facility] on  02/15/23 for lt TKR. PMH - Rt TKR, htn, CLL, gastric bypass    PT Comments    Pt progressing well with mobility and she is hopeful for DC later today. Will practice a stair later today if she is going home today. Nauseated at end of session.   Recommendations for follow up therapy are one component of a multi-disciplinary discharge planning process, led by the attending physician.  Recommendations may be updated based on patient status, additional functional criteria and insurance authorization.  Follow Up Recommendations       Assistance Recommended at Discharge Intermittent Supervision/Assistance  Patient can return home with the following A little help with walking and/or transfers;A little help with bathing/dressing/bathroom;Help with stairs or ramp for entrance;Assist for transportation;Assistance with cooking/housework   Equipment Recommendations  None recommended by PT    Recommendations for Other Services       Precautions / Restrictions Precautions Precautions: Knee Precaution Booklet Issued: Yes (comment) Restrictions Weight Bearing Restrictions: Yes LLE Weight Bearing: Weight bearing as tolerated     Mobility  Bed Mobility Overal bed mobility: Needs Assistance Bed Mobility: Supine to Sit     Supine to sit: Min assist     General bed mobility comments:  (Needed assist with LLE, HOB raised)    Transfers Overall transfer level: Needs assistance Equipment used: Rolling walker (2 wheels) Transfers: Sit to/from Stand Sit to Stand: Supervision           General transfer comment: VCs for safest technique    Ambulation/Gait Ambulation/Gait assistance: Supervision Gait Distance (Feet): 125 Feet Assistive device: Rolling walker (2 wheels) Gait Pattern/deviations:  Decreased step length - right, Decreased stance time - left, Antalgic Gait velocity: decr Gait velocity interpretation: <1.31 ft/sec, indicative of household Biomedical scientist Rankin (Stroke Patients Only)       Balance Overall balance assessment: Mild deficits observed, not formally tested                                          Cognition Arousal/Alertness: Awake/alert Behavior During Therapy: WFL for tasks assessed/performed Overall Cognitive Status: Within Functional Limits for tasks assessed                                          Exercises Total Joint Exercises Ankle Circles/Pumps: AROM, Both, 10 reps, Supine Quad Sets: AROM, Left, 10 reps, Supine Short Arc Quad: AAROM, Left, 10 reps, Supine Heel Slides: AAROM, Left, 10 reps, Supine Hip ABduction/ADduction: AAROM, Left, 10 reps, Supine Straight Leg Raises: AAROM, Left, 10 reps, Supine Long Arc Quad: AAROM, Left, 10 reps, Seated Knee Flexion: AAROM Goniometric ROM: 76 degrees flexion in sitting, bulky bandage my limit accuracy    General Comments General comments (skin integrity, edema, etc.): pt nauseated at end of session. No family present      Pertinent Vitals/Pain Pain Assessment Pain Assessment: 0-10 Pain Score: 4  Pain Location: lt knee Pain Descriptors / Indicators: Grimacing Pain Intervention(s): Premedicated before  session, Limited activity within patient's tolerance, Monitored during session    Home Living                          Prior Function            PT Goals (current goals can now be found in the care plan section) Acute Rehab PT Goals Patient Stated Goal: return home Progress towards PT goals: Progressing toward goals    Frequency    7X/week      PT Plan Current plan remains appropriate    Co-evaluation              AM-PAC PT "6 Clicks" Mobility   Outcome  Measure  Help needed turning from your back to your side while in a flat bed without using bedrails?: A Little Help needed moving from lying on your back to sitting on the side of a flat bed without using bedrails?: A Little Help needed moving to and from a bed to a chair (including a wheelchair)?: A Little Help needed standing up from a chair using your arms (e.g., wheelchair or bedside chair)?: A Little Help needed to walk in hospital room?: A Little Help needed climbing 3-5 steps with a railing? : A Little 6 Click Score: 18    End of Session Equipment Utilized During Treatment: Gait belt Activity Tolerance: Patient tolerated treatment well Patient left: in chair;with call bell/phone within reach Nurse Communication: Mobility status PT Visit Diagnosis: Other abnormalities of gait and mobility (R26.89);Pain Pain - Right/Left: Left Pain - part of body: Knee     Time: 1027-2536 PT Time Calculation (min) (ACUTE ONLY): 35 min  Charges:  $Gait Training: 8-22 mins $Therapeutic Exercise: 8-22 mins                     Lavona Mound, PT   Acute Rehabilitation Services  Office [phone] 02/16/2023    Donnella Sham 02/16/2023, 8:51 AM

## 2023-02-16 NOTE — Progress Notes (Signed)
Late entry    02/16/23 1100  PT Visit Information  Last PT Received On 02/16/23  Assistance Needed +1  History of Present Illness Pt is 72 year old presented to [facility] on  02/15/23 for lt TKR. PMH - Rt TKR, htn, CLL, gastric bypass  Precautions  Precautions Knee  Restrictions  Weight Bearing Restrictions Yes  LLE Weight Bearing WBAT  Pain Assessment  Pain Assessment 0-10  Pain Score 4  Pain Location lt knee  Pain Intervention(s) Limited activity within patient's tolerance;Monitored during session;Premedicated before session  Cognition  Arousal/Alertness Awake/alert  Behavior During Therapy WFL for tasks assessed/performed  Overall Cognitive Status Within Functional Limits for tasks assessed  Bed Mobility  Overal bed mobility Needs Assistance  Bed Mobility Supine to Sit;Sit to Supine  Supine to sit Min assist  Sit to supine Min assist  General bed mobility comments needed assisy with LLE  Transfers  Overall transfer level Needs assistance  Equipment used Rolling walker (2 wheels)  Transfers Sit to/from Stand  Sit to Stand Supervision  Ambulation/Gait  Ambulation/Gait assistance Supervision  Gait Distance (Feet) 10 Feet  Assistive device Rolling walker (2 wheels)  Gait Pattern/deviations Decreased step length - right;Decreased stance time - left;Antalgic  Stairs Yes  Stairs assistance Supervision  Stair Management Forwards;With walker  Number of Stairs 1 (Performed twice)  General Comments  General comments (skin integrity, edema, etc.) Focus on second session was stair training prior to DC  PT - End of Session  Equipment Utilized During Treatment Gait belt  Activity Tolerance Patient tolerated treatment well  Patient left in bed;with call bell/phone within reach  Nurse Communication Other (comment) (PT completed)   PT - Assessment/Plan  PT Plan Other (comment) (Anticipated DC home today)  PT Visit Diagnosis Other abnormalities of gait and mobility (R26.89);Pain   Pain - Right/Left Left  Pain - part of body Knee  PT Frequency (ACUTE ONLY) 7X/week  Follow Up Recommendations Follow physician's recommendations for discharge plan and follow up therapies  Assistance recommended at discharge Intermittent Supervision/Assistance  Patient can return home with the following A little help with walking and/or transfers;A little help with bathing/dressing/bathroom;Help with stairs or ramp for entrance;Assist for transportation;Assistance with cooking/housework  PT equipment None recommended by PT  AM-PAC PT "6 Clicks" Mobility Outcome Measure (Version 2)  Help needed turning from your back to your side while in a flat bed without using bedrails? 3  Help needed moving from lying on your back to sitting on the side of a flat bed without using bedrails? 3  Help needed moving to and from a bed to a chair (including a wheelchair)? 3  Help needed standing up from a chair using your arms (e.g., wheelchair or bedside chair)? 3  Help needed to walk in hospital room? 3  Help needed climbing 3-5 steps with a railing?  3  6 Click Score 18  Consider Recommendation of Discharge To: Home with [facility]  Progressive Mobility  What is the highest level of mobility based on the progressive mobility assessment? Level 5 (Walks with assist in room/hall) - Balance while stepping forward/back and can walk in room with assist - Complete  Activity Ambulated with assistance in room  PT Goal Progression  Progress towards PT goals Progressing toward goals  PT Time Calculation  PT Start Time (ACUTE ONLY) 1050  PT Stop Time (ACUTE ONLY) 1059  PT Time Calculation (min) (ACUTE ONLY) 9 min  PT General Charges  $$ ACUTE PT VISIT 1 Visit  PT Treatments  $Gait Training 8-22 mins   Lavona Mound, PT   Acute Rehabilitation Services  Office [phone] 02/16/2023

## 2023-03-01 ENCOUNTER — Encounter: Payer: Self-pay | Admitting: Family

## 2023-03-01 ENCOUNTER — Ambulatory Visit (INDEPENDENT_AMBULATORY_CARE_PROVIDER_SITE_OTHER): Payer: Medicare HMO | Admitting: Family

## 2023-03-01 ENCOUNTER — Other Ambulatory Visit (INDEPENDENT_AMBULATORY_CARE_PROVIDER_SITE_OTHER): Payer: Medicare HMO

## 2023-03-01 DIAGNOSIS — M1712 Unilateral primary osteoarthritis, left knee: Secondary | ICD-10-CM | POA: Diagnosis not present

## 2023-03-01 NOTE — Progress Notes (Signed)
Post-Op Visit Note   Patient: Alice Scott           Date of Birth: 1951/08/27           MRN: [id number] Visit Date: 03/01/2023 PCP: Ladora Daniel, PA-C  Chief Complaint:  Chief Complaint  Patient presents with   Left Knee - Routine Post Op    02/15/2023 left total knee replacement     HPI:  HPI The patient is a 72 year old woman seen 2 weeks status post left total knee arthroplasty she has completed her home physical therapy and begins outpatient physical therapy next week.  She is pleased with her progress.  No concerns of pain. Ortho Exam On examination left knee staples are in place the incision is well-healed she has full extension and flexion to 93 degrees.  There is no erythema  Visit Diagnoses:  1. Unilateral primary osteoarthritis, left knee     Plan: Staples harvested today without incident.  Proceed with physical therapy.  She will follow-up in the office in 4 weeks.  Follow-Up Instructions: Return in about 4 weeks (around 03/29/2023).   Imaging: XR Knee 1-2 Views Left  Result Date: 03/01/2023 Radiographs of the left knee show stable alignment of total knee arthroplasty hardware.  No complicating feature.   Orders:  Orders Placed This Encounter  Procedures   XR Knee 1-2 Views Left   No orders of the defined types were placed in this encounter.    PMFS History: Patient Active Problem List   Diagnosis Date Noted   Unilateral primary osteoarthritis, left knee 02/15/2023   Total knee replacement status, left 02/15/2023   Closed displaced fracture of proximal phalanx of right little finger 03/01/2022   Closed displaced fracture of proximal phalanx of right ring finger 03/01/2022   H/O total knee replacement, 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   Past Medical History:  Diagnosis Date   Anemia    Arthritis    CLL (chronic lymphocytic leukemia) (HCC)    pt states she has not needed treatment   GERD (gastroesophageal reflux disease)    Gout    History of kidney stones     Hypertension    Hypothyroidism    Osteoarthritis of left knee    Pre-diabetes    no longer takes medication    History reviewed. No pertinent family history.  Past Surgical History:  Procedure Laterality Date   ABDOMINAL HYSTERECTOMY     APPENDECTOMY     arthroscopic knee Right    CHOLECYSTECTOMY     CLOSED REDUCTION FINGER WITH PERCUTANEOUS PINNING Right 03/07/2022   Procedure: RIGHT RING AND SMALL FINGER CLOSED REDUCTION  WITH PERCUTANEOUS PINNING;  Surgeon: Marlyne Beards, MD;  Location: [facility];  Service: Orthopedics;  Laterality: Right;   GASTRIC BYPASS  2004   TOTAL KNEE ARTHROPLASTY Right 05/13/2013   Procedure: TOTAL KNEE ARTHROPLASTY;  Surgeon: Nadara Mustard, MD;  Location: [facility] OR;  Service: Orthopedics;  Laterality: Right;  Right Total Knee Arthroplasty   TOTAL KNEE ARTHROPLASTY Left 02/15/2023   Procedure: LEFT TOTAL KNEE REPLACEMENT;  Surgeon: Nadara Mustard, MD;  Location: [facility] OR;  Service: Orthopedics;  Laterality: Left;   Social History   Occupational History   Not on file  Tobacco Use   Smoking status: Never   Smokeless tobacco: Never  Vaping Use   Vaping Use: Never used  Substance and Sexual Activity   Alcohol use: No   Drug use: No   Sexual activity: Not on file

## 2023-04-02 ENCOUNTER — Ambulatory Visit: Payer: Medicare HMO | Admitting: Family

## 2023-04-02 DIAGNOSIS — Z96651 Presence of right artificial knee joint: Secondary | ICD-10-CM

## 2023-04-03 ENCOUNTER — Encounter: Payer: Self-pay | Admitting: Family

## 2023-04-03 NOTE — Progress Notes (Signed)
   Post-Op Visit Note   Patient: Alice Scott           Date of Birth: [birth year]           MRN: [id number] Visit Date: 04/02/2023 PCP: Ladora Daniel, PA-C  Chief Complaint:  Chief Complaint  Patient presents with   Left Knee - Pain    HPI:  HPI The patient is a 72 year old woman who is seen status post left total knee arthroplasty April 26 she has been doing well she has been in physical therapy feels she is progressing she does have 1 area of her incision that has a pinpoint opening her physical therapist thought she might have a retained suture  Is ambulating with a cane Ortho Exam On examination of the left knee she lacks about 5 degrees full extension flexion to 120.  Over her incision she does have 1 pinpoint open area with a retained suture was harvested with pickups.  There is no erythema or drainage  Visit Diagnoses: No diagnosis found.  Plan: Proceed with physical therapy.  She will follow-up in the office as needed  Follow-Up Instructions: No follow-ups on file.   Imaging: No results found.  Orders:  No orders of the defined types were placed in this encounter.  No orders of the defined types were placed in this encounter.    PMFS History: Patient Active Problem List   Diagnosis Date Noted   Unilateral primary osteoarthritis, left knee 02/15/2023   Total knee replacement status, left 02/15/2023   Closed displaced fracture of proximal phalanx of right little finger 03/01/2022   Closed displaced fracture of proximal phalanx of right ring finger 03/01/2022   H/O total knee replacement, 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   Past Medical History:  Diagnosis Date   Anemia    Arthritis    CLL (chronic lymphocytic leukemia) (HCC)    pt states she has not needed treatment   GERD (gastroesophageal reflux disease)    Gout    History of kidney stones    Hypertension    Hypothyroidism    Osteoarthritis of left knee    Pre-diabetes    no longer takes medication    No family  history on file.  Past Surgical History:  Procedure Laterality Date   ABDOMINAL HYSTERECTOMY     APPENDECTOMY     arthroscopic knee Right    CHOLECYSTECTOMY     CLOSED REDUCTION FINGER WITH PERCUTANEOUS PINNING Right 03/07/2022   Procedure: RIGHT RING AND SMALL FINGER CLOSED REDUCTION  WITH PERCUTANEOUS PINNING;  Surgeon: Marlyne Beards, MD;  Location: [facility];  Service: Orthopedics;  Laterality: Right;   GASTRIC BYPASS  2004   TOTAL KNEE ARTHROPLASTY Right 05/13/2013   Procedure: TOTAL KNEE ARTHROPLASTY;  Surgeon: Nadara Mustard, MD;  Location: [facility] OR;  Service: Orthopedics;  Laterality: Right;  Right Total Knee Arthroplasty   TOTAL KNEE ARTHROPLASTY Left 02/15/2023   Procedure: LEFT TOTAL KNEE REPLACEMENT;  Surgeon: Nadara Mustard, MD;  Location: [facility] OR;  Service: Orthopedics;  Laterality: Left;   Social History   Occupational History   Not on file  Tobacco Use   Smoking status: Never   Smokeless tobacco: Never  Vaping Use   Vaping Use: Never used  Substance and Sexual Activity   Alcohol use: No   Drug use: No   Sexual activity: Not on file

## 2024-01-12 IMAGING — DX DG HAND COMPLETE 3+V*R*
3 series · 3 of 3 positions shown · non-contrast
Comparison: None Available.

CLINICAL DATA: Right hand pain after fall

EXAM:
RIGHT HAND - COMPLETE 3+ VIEW

[hand ap]
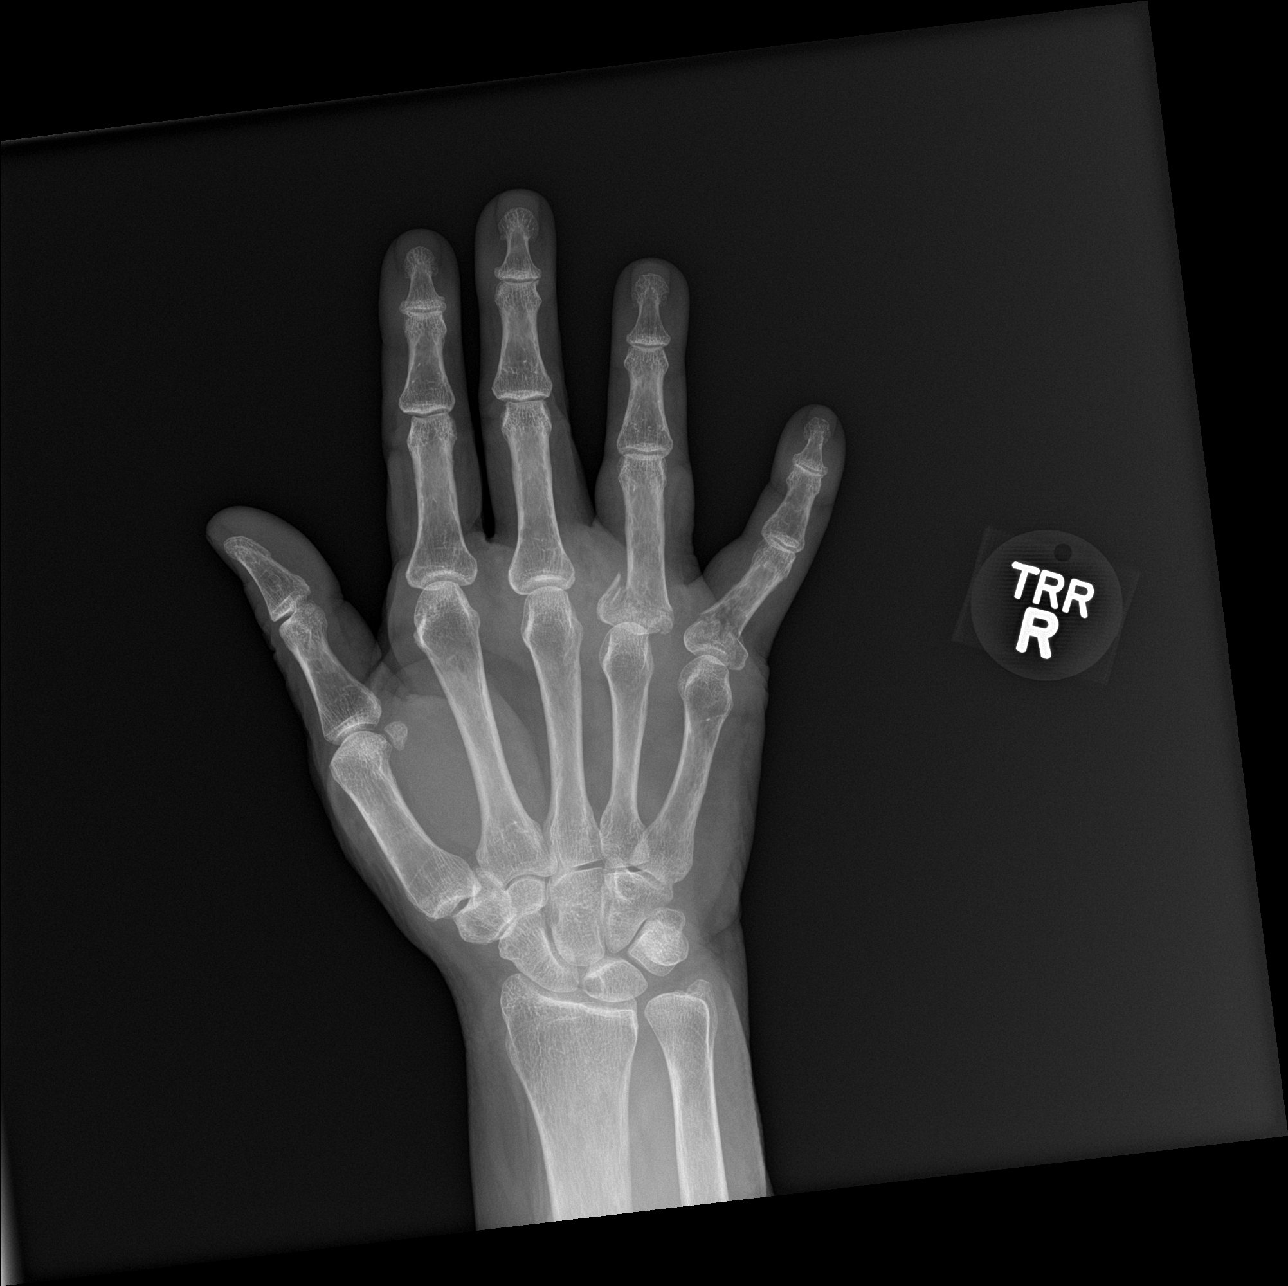

[hand obl]
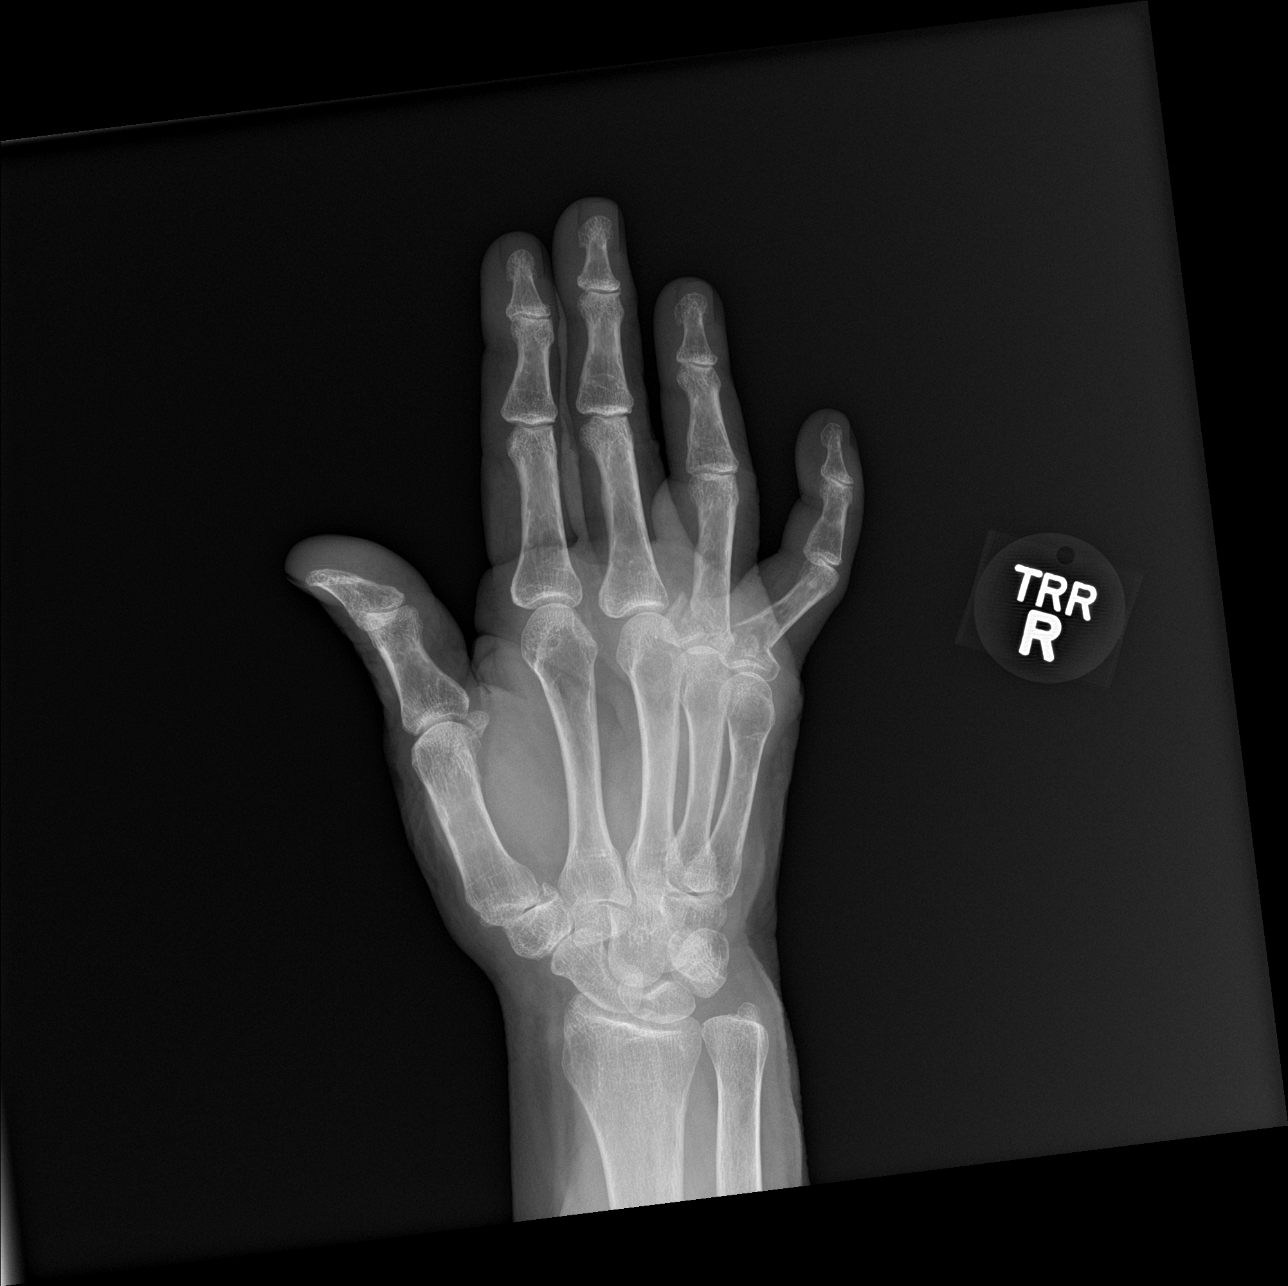

[hand lat]
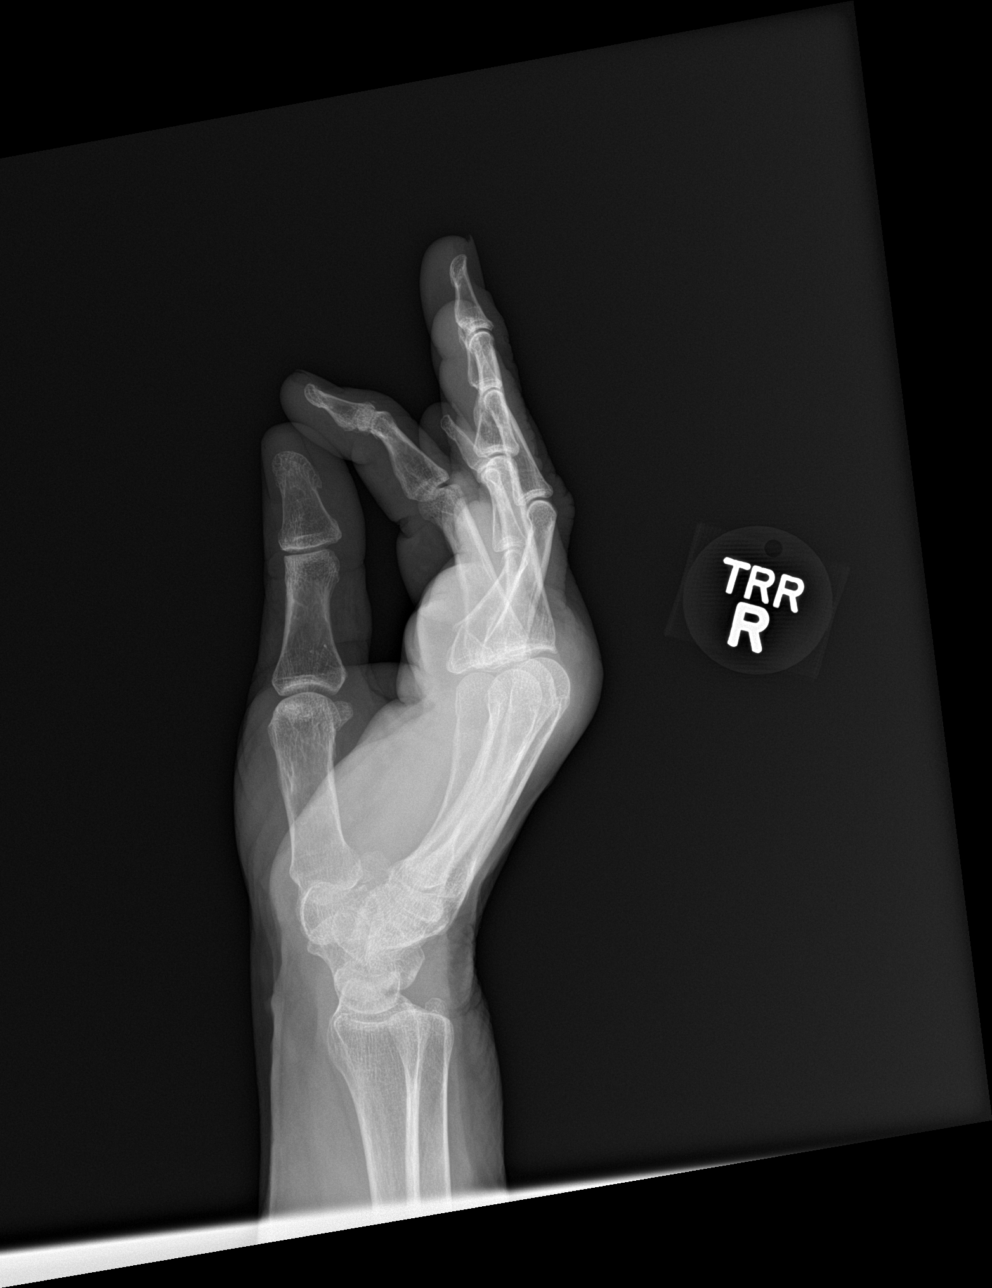

[3 of 3 positions shown; findings below may reference images not displayed]

FINDINGS: Acute comminuted fractures of the bases of the ring and small finger
proximal phalanxes. Both fracture sites are dorsally angulated.
There is intra-articular extension into the fourth and fifth MCP
joints. No dislocation. No additional fractures. Soft tissue
swelling at the fracture sites.
IMPRESSION: Acute, comminuted, and angulated intra-articular fractures of the
bases of the ring and small finger proximal phalanxes. No
dislocation.
